# Patient Record
Sex: Female | Born: 1960 | Race: Black or African American | Hispanic: No | Marital: Single | Smoking: Current every day smoker
Health system: Southern US, Community
[De-identification: ages and names within clinical notes are randomized; demographics above are authoritative.]

## PROBLEM LIST (undated history)

## (undated) HISTORY — PX: EYE SURGERY: SHX253

---

## 2012-02-20 ENCOUNTER — Ambulatory Visit
Admission: RE | Admit: 2012-02-20 | Discharge: 2012-02-20 | Disposition: A | Discharge status: Home / Self Care | Payer: Medicaid Other | Source: Ambulatory Visit | Attending: Family Medicine | Admitting: Family Medicine

## 2012-02-20 ENCOUNTER — Other Ambulatory Visit: Payer: Self-pay | Admitting: Family Medicine

## 2012-02-20 DIAGNOSIS — I10 Essential (primary) hypertension: Secondary | ICD-10-CM

## 2012-02-20 DIAGNOSIS — F17200 Nicotine dependence, unspecified, uncomplicated: Secondary | ICD-10-CM

## 2013-11-23 ENCOUNTER — Ambulatory Visit (INDEPENDENT_AMBULATORY_CARE_PROVIDER_SITE_OTHER): Payer: PRIVATE HEALTH INSURANCE | Admitting: Family Medicine

## 2013-11-23 ENCOUNTER — Ambulatory Visit (INDEPENDENT_AMBULATORY_CARE_PROVIDER_SITE_OTHER): Payer: PRIVATE HEALTH INSURANCE

## 2013-11-23 VITALS — BP 160/92 | HR 71 | Temp 98.2°F | Resp 20 | Ht 68.0 in | Wt 210.4 lb

## 2013-11-23 DIAGNOSIS — R030 Elevated blood-pressure reading, without diagnosis of hypertension: Secondary | ICD-10-CM

## 2013-11-23 DIAGNOSIS — M542 Cervicalgia: Secondary | ICD-10-CM

## 2013-11-23 DIAGNOSIS — M62838 Other muscle spasm: Secondary | ICD-10-CM

## 2013-11-23 DIAGNOSIS — IMO0001 Reserved for inherently not codable concepts without codable children: Secondary | ICD-10-CM

## 2013-11-23 DIAGNOSIS — M6248 Contracture of muscle, other site: Secondary | ICD-10-CM

## 2013-11-23 DIAGNOSIS — L509 Urticaria, unspecified: Secondary | ICD-10-CM

## 2013-11-23 MED ORDER — CYCLOBENZAPRINE HCL 5 MG PO TABS: ORAL_TABLET | ORAL | Status: AC

## 2013-11-23 MED ORDER — MELOXICAM 7.5 MG PO TABS: 7.5000 mg | ORAL_TABLET | Freq: Every day | ORAL | Status: AC
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
## 2013-11-24 ENCOUNTER — Telehealth: Payer: Self-pay

## 2013-11-24 NOTE — Telephone Encounter (Signed)
Pt states she did get out of work note until nov 11,but note must say rtw with no restrictions, Please fax to [phone]   Best phone for pt is [phone]

## 2013-11-25 NOTE — Telephone Encounter (Signed)
Printed note. Called and informed pt I will fax to given number.

## 2013-11-26 ENCOUNTER — Telehealth: Payer: Self-pay

## 2013-11-26 NOTE — Telephone Encounter (Signed)
Reprinted note- the one originally faxed states pt may rtn to work today without restrictions. LM for pt to rtn call and see if anything else needs to be indicated on this note for her employer to accept.

## 2013-11-26 NOTE — Telephone Encounter (Signed)
Pt states we faxed a work note for her, but it wasn't accepted from her job, the note need to say she can return to work today with no restrictions and that wasn't put on there Please fax to [phone] attn: payroll and you may reach pt at [phone] if needed
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
## 2019-07-24 ENCOUNTER — Ambulatory Visit: Admit: 2019-07-24 | Payer: PRIVATE HEALTH INSURANCE | Primary: Adolescent Medicine

## 2019-07-24 ENCOUNTER — Inpatient Hospital Stay: Admit: 2019-07-24 | Discharge: 2019-07-24 | Payer: PRIVATE HEALTH INSURANCE | Primary: Adolescent Medicine

## 2019-07-24 ENCOUNTER — Encounter: Admit: 2019-07-24 | Payer: PRIVATE HEALTH INSURANCE | Primary: Adolescent Medicine

## 2019-07-24 DIAGNOSIS — J449 Chronic obstructive pulmonary disease, unspecified: Secondary | ICD-10-CM

## 2019-07-24 DIAGNOSIS — M329 Systemic lupus erythematosus, unspecified: Secondary | ICD-10-CM

## 2019-07-24 LAB — C3 COMPLEMENT: BKR C3 COMPLEMENT: 160 mg/dL (ref 90–180)

## 2019-07-24 LAB — C4 COMPLEMENT     (BH GH L LMW YH): BKR C4 COMPLEMENT: 48 mg/dL — ABNORMAL HIGH (ref 10–40)

## 2019-07-28 LAB — CHROMATIN (NUCLEOSOMAL) ANTIBODY (BH GH LMW Q YH): CHROMATIN (NUCLEOSOMAL) ANTIBODY: <1

## 2019-07-28 LAB — DNA ANTIBODY, DOUBLE-STRANDED: BKR DSDNA AB: 4.2 IU/mL (ref <10.0)

## 2019-08-04 LAB — ALPHA-1-ANTITRYPSIN PHENOTYPE   (BH GH LMW Q YH)

## 2019-11-18 ENCOUNTER — Ambulatory Visit: Admit: 2019-11-18 | Payer: PRIVATE HEALTH INSURANCE | Attending: Internal Medicine | Primary: Adolescent Medicine

## 2019-11-18 DIAGNOSIS — Z20822 Contact with and (suspected) exposure to covid-19: Secondary | ICD-10-CM

## 2019-11-20 ENCOUNTER — Ambulatory Visit: Admit: 2019-11-20 | Payer: PRIVATE HEALTH INSURANCE | Attending: Internal Medicine | Primary: Adolescent Medicine

## 2019-11-20 DIAGNOSIS — Z20822 Contact with and (suspected) exposure to covid-19: Secondary | ICD-10-CM

## 2020-11-30 ENCOUNTER — Encounter: Admit: 2020-11-30 | Payer: PRIVATE HEALTH INSURANCE | Primary: Adolescent Medicine

## 2021-02-14 ENCOUNTER — Inpatient Hospital Stay: Admit: 2021-02-14 | Discharge: 2021-02-14 | Payer: PRIVATE HEALTH INSURANCE | Primary: Adolescent Medicine

## 2021-02-14 DIAGNOSIS — J849 Interstitial pulmonary disease, unspecified: Secondary | ICD-10-CM

## 2021-05-10 ENCOUNTER — Ambulatory Visit: Admit: 2021-05-10 | Payer: PRIVATE HEALTH INSURANCE | Attending: Rheumatology | Primary: Adolescent Medicine

## 2021-05-10 DIAGNOSIS — J849 Interstitial pulmonary disease, unspecified: Secondary | ICD-10-CM

## 2021-05-10 DIAGNOSIS — M329 Systemic lupus erythematosus, unspecified: Secondary | ICD-10-CM

## 2021-05-10 DIAGNOSIS — J449 Chronic obstructive pulmonary disease, unspecified: Secondary | ICD-10-CM

## 2021-05-10 DIAGNOSIS — Z780 Asymptomatic menopausal state: Secondary | ICD-10-CM

## 2021-05-10 NOTE — Progress Notes
[facility] [place], [place], [address]ZDGLO: [phone]  Fax: [name] Damya Comley, MDSonia Kallie Locks, MDKristin Vassie Loll PA-CHPI DETAILS: Sheila Turner presents today for Rheumatology Consultation. She is a former patient of mine last seen more than 15 years ago.I have reviewed the patient's referral records, including past medical history, past physical exams, pertinent labs, imaging and prior plans of care.At her initial presentation she notes that she would often awake with pain in her arms or legs. She was evaluated and diagnosed with SLE. She was treated with Plaquenil and low dose prednisone and did well. Her mother, Sheila Turner, also a former patient of mine, died in 07/16/11 and Sheila Turner moved to [place]. She returned to [place] in 16-Jul-2014. At that time she was experiencing diffuse urticaria. She was evaluated at the [facility] and was treated with antihistamines without any relief of her symptoms. She was then seen at an urgent care center by Dr. Pricilla Riffle who treated her with Atarax which she found helpful. She was then evaluated by Dr. Marko Plume, an allergist, who felt that her urticaria was related to her SLE. He treated her with Xolair injections. Dr. Arie Sabina died and she was then seen by Dr. Polo Riley. Her urticaria resolved and she did no require any further treatment. She was then evaluated by Dr. Atilano Median for her SLE but was lost to follow up after 1 visit. She comes in today for consultation.On 02/14/2021 she had a high resolution [place] that revealed Interstitial lung disease as described above. Findings most likely represent lupus related interstitial lung disease although some of these changes could be smoking-related given the patient's history. She was started on Cellcept by Dr. Damian Leavell. ROS:  She denies systemic symptoms specifically denying fever, fatigue, rash, adenopathy, dry eyes, dry mouth, oral ulcerations, alopecia, anorexia, weight loss, Raynaud?s phenomenon, dysphagia, photosensitivity, chest pain, SOB, or abdominal pain.  She also denies eye inflammation, urinary tract symptoms, urethritis or diarrhea. She also denies scalp tenderness, jaw claudication, or visual disturbance.PMH: SLE, Hypertension, interstitial lung disease, COPD, urticaria, post cholecystectomy pulmonary embolism.PSH: Hysterectomy 07/16/94), Cholecystectomy.FH: Her mother had arthritis. She has no family history of other Rheumatic Disease, Inflammatory Bowel Disease or Psoriasis. SH: She smokes 2-4 cigarettes a day having smoked 1 pack every 2-3 days in the past. She does not drink Alcohol. She drinks 2 cup of coffee a day and 2-3 cans of soda daily. She does not exercise. She is married x3 divorced x2. She has 4 children 46m, 44m, 68m, 58f. She has 14 grandchildren. 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 Current Outpatient Medications on File Prior to Visit Medication Sig Dispense Refill ? albuterol sulfate (VENTOLIN HFA) 90 mcg/actuation HFA aerosol inhaler Inhale 2 puffs into the lungs every 6 (six) hours as needed for wheezing or shortness of breath. 18 g 11 ? EPINEPHrine (EPI-PEN) 0.3 mg/0.3 mL Inject 0.3 mg into the muscle once as needed. for up to 1 dose. As directed by MD. 2 Device 0 ? fluticasone furoate-umeclidinium-vilanterol (TRELEGY ELLIPTA) 100-62.5-25 mcg powder for inhalation Inhale 1 puff into the lungs daily. 60 each 11 ? hydroCHLOROthiazide (HYDRODIURIL) 25 MG tablet Take 10 mg by mouth daily..   ? hydrOXYzine (ATARAX) 25 mg tablet Take 1 tablet (25 mg total) by mouth every 6 (six) hours as needed for itching. 120 tablet 0 ? mycophenolate mofetil (CELLCEPT) 500 mg tablet Take 1 tablet (500 mg  total) by mouth 2 (two) times daily. 60 tablet 3 ? nystatin (MYCOSTATIN) 100,000 unit/mL suspension Take 5 mLs (500,000 Units total) by mouth 4 (four) times daily. 473 mL 2 ? omeprazole (PRILOSEC) 40 MG capsule Take 1 capsule (40 mg total) by mouth daily.   ? predniSONE (DELTASONE) 5 mg tablet Take 1 tablet (5 mg total) by mouth daily. Take with food. 30 tablet 3 ? verapamil (CALAN) 80 MG Immediate Release tablet Take 1 tablet (80 mg total) by mouth 3 (three) times daily.   ? benzonatate (TESSALON) 200 mg capsule Take 1 capsule (200 mg total) by mouth 3 (three) times daily as needed for cough. (Patient not taking: Reported on 05/10/2021) 30 capsule 2 ? buPROPion SR (WELLBUTRIN SR) 150 mg 12 hr tablet Take 1 tablet (150 mg total) by mouth 2 (two) times daily. (Patient not taking: Reported on 05/10/2021) 60 tablet 3 ? montelukast (SINGULAIR) 10 mg tablet Take 1 tablet (10 mg total) by mouth nightly. (Patient not taking: Reported on 05/10/2021) 30 tablet 6 Current Facility-Administered Medications on File Prior to Visit Medication Dose Route Frequency Provider Last Rate Last Admin ? OMALIzumab Geoffry [place]) injection 150 mg  150 mg Subcutaneous Q28 Days Marko Plume, MD   150 mg at 09/28/16 1645 ? OMALIzumab Geoffry Paradise) injection 300 mg  300 mg Subcutaneous Q28 Days Marko Plume, MD   300 mg at 11/16/16 1625 Physical Exam EXAM DETAILS: BP (!) (P) 144/83  - Pulse (P) 79  - Wt 107.5 kg  - LMP  (LMP Unknown)  - SpO2 (P) 98%  - BMI 37.12 kg/m? On exam today she was overweight but otherwise well appearing with a normal gait. She was oriented x 3. She had multiple moles on her face, back, and arms. She had no oral lesions. Conjunctivae were clear. She had no cervical or axillary adenopathy. Lungs were clear. Heart exam was WNL. She had no cyanosis or clubbing.  On examination of her joints she had normal DIPs, PIPs & MCPs.  She had full ROM of her wrists, elbows & shoulders.  She had full ROM of her neck & back.  She had full ROM of her hips.  Knees, ankles and feet were WNL. She had no acute synovitis of any joints. She had no pedal edema.Laboratory test results from 08/10/2016 were reviewed.ImagingHigh Contrast Chest [place] (02/14/2021) IMPRESSION: Interstitial lung disease as described above. Findings most likely represent lupus related interstitial lung disease although some of these changes could be smoking-related given the patient's history. ASSESSMENT/PLAN DETAILS: SLEMs. Sheila Turner comes in today for rheumatology consultation. She has a history of systemic lupus erythematosus and has been treated with Plaquenil in the past. More recently, she was noted on 02/14/2021 to have findings on high resolution [place] that most likely represent lupus related interstitial lung disease although some of these changes could be smoking-related given the patient's history. She was started on Cellcept by Dr. Damian Leavell. Currently, she has no other symptoms suggestive of active SLE. She denies joint pain, joint swelling, fatigue, or other systemic symptoms. To further evaluate her, appropriate laboratory studies were obtained. She will follow up when available. In the interim she will continue prednisone 5 mg qd and Cellcept 500 mg bid.Bone HealthDXA BMD was ordered.COVID-19She has received 2 doses of the Covid-19 Vaccine. She was advised to follow safety precautions. Scribed for Willoughby Doell, Mickeal Needy, MD by Chapman Moss, medical scribe May 06, 2021  The documentation recorded by the scribe accurately reflects the services I personally performed and the decisions  made by me. I reviewed and confirmed all material entered and/or pre-charted by the scribe. Mickeal Needy Mosie Angus, MDElectronically Signed by Mickeal Needy Keiran Sias, MD, April 25, 2023On the day of this patient's encounter, a total of 97 minutes was personally spent by me.  This does not include any resident/fellow teaching time, or any time spent performing a procedural service.

## 2021-05-13 ENCOUNTER — Encounter
Admit: 2021-05-13 | Payer: PRIVATE HEALTH INSURANCE | Attending: Vascular and Interventional Radiology | Primary: Adolescent Medicine

## 2021-05-16 ENCOUNTER — Ambulatory Visit: Admit: 2021-05-16 | Payer: PRIVATE HEALTH INSURANCE | Primary: Adolescent Medicine

## 2021-05-19 ENCOUNTER — Encounter: Admit: 2021-05-19 | Payer: PRIVATE HEALTH INSURANCE | Primary: Adolescent Medicine

## 2021-05-19 LAB — PROTEIN, TOTAL W/CREAT, RANDOM URINE     (L Q)
CREATININE, RANDOM URINE: 227 mg/dL (ref 20–275)
PROTEIN, TOTAL AND CREAT, URINE, 24 HOUR: 70 mg/g creat (ref <20.0)
PROTEIN, TOTAL, RANDOM UR: 16 mg/dL (ref <=2)
PROTEIN/CREATININE RATIO: 0.07 mg/mg creat (ref <=5)

## 2021-05-19 LAB — URINALYSIS-MACROSCOPIC W/REFLEX MICROSCOPIC
BACTERIA, UA: NONE SEEN /HPF
BILIRUBIN: NEGATIVE
GLUCOSE UA (QUEST): NEGATIVE ng/mL — ABNORMAL LOW (ref 30–100)
HYALINE CASTS: NONE SEEN /LPF
LEUKOCYTE ESTERASE: NEGATIVE
NITRITE UA: NEGATIVE
PH: 6.5 g/dL (ref 5.0–8.0)
SPECIFIC GRAVITY UA: 1.017 (ref 1.001–1.035)
WBC, UA: NONE SEEN /HPF (ref <=5)

## 2021-05-19 LAB — VITAMIN D, 25-HYDROXY: VITAMIN D, 25 OH, TOTAL: 12 ng/mL — ABNORMAL LOW (ref 30–100)

## 2021-05-19 LAB — SEDIMENTATION RATE (ESR)

## 2021-05-19 LAB — BETA-2 GLYCOPROTEIN 1 ABS,IGG, IGA, IGM
B2 GLYCOPROTEIN I (IGG)AB: <2 U/mL (ref <20.0)
B2 GLYCOPROTEIN I (IGM) AB: <2 U/mL (ref <20.0)
B2-GLYCOPROTEIN IGA: <2 U/mL (ref <20.0)

## 2021-05-19 LAB — PROTEIN ELECTROPHORESIS, TOTAL      (L Q)
ALBUMIN, SPEP: 3.7 g/dL — ABNORMAL LOW (ref 3.8–4.8)
ALPHA-1-GLOBULIN, SERUM: 0.3 g/dL (ref 0.2–0.3)
ALPHA-2-GLOBULIN, SERUM: 0.9 g/dL (ref 0.5–0.9)
BETA-1-GLOBULIN, SERUM: 0.4 g/dL (ref 0.4–0.6)
BETA-2-GLOBULIN, SERUM: 0.5 g/dL — AB (ref <20.0)
GAMMA GLOBULINS, SERUM: 1.6 g/dL (ref <20.0)
PROTEIN, TOTAL, SPEP: 7.4 g/dL (ref 6.1–8.1)

## 2021-05-19 LAB — LUPUS ANTICOAGULANT EVALUATION WITH REFLEX     (L Q)
DILUTE RUSSELL VIPER VENOM TIME: 45 s (ref <=45)
HEXAGONAL PHASE CONFIRM: NEGATIVE
PTT LA SCREEN: 46 s — ABNORMAL HIGH (ref <=40)

## 2021-05-19 LAB — SJOGRENS AB, SS-B     (BH GH LMW Q YH): SJOGREN'S ANTIBODY (SS-B): <1 AI (ref <1.0)

## 2021-05-19 LAB — SM AND SM/RNP ANTIBODIES     (Q)
SM ANTIBODY: <1 AI (ref <1.0)
SM/RNP ANTIBODY: <1 AI (ref <1.0)

## 2021-05-19 LAB — C-REACTIVE PROTEIN     (CRP): C-REACTIVE PROTEIN: 23.5 mg/L — ABNORMAL HIGH (ref <8.0)

## 2021-05-19 LAB — RHEUMATOID FACTOR: RHEUMATOID FACTOR: <14 IU/mL (ref <14)

## 2021-05-19 LAB — CARDIOLIPIN ANTIBODY, IGG     (BH GH LMW Q YH): CARDIOLIPIN IGG: <2 [GPL'U]/mL

## 2021-05-19 LAB — ANTI-JO 1 ANTIBODY, IGG: JO1 ANTIBODY: <1 AI (ref <1.0)

## 2021-05-19 LAB — DNA ANTIBODY, DOUBLE-STRANDED: DNA DS: 1 IU/mL

## 2021-05-19 LAB — CARDIOLIPIN ANTIBODY, IGM     (BH GH LMW Q YH): CARDIOLIPIN IGM: <2 MPL-U/mL

## 2021-05-19 LAB — ANA IFA SCREEN W/REFL TO TITER AND PATTERN, IFA: ANA SCREEN, IFA: NEGATIVE

## 2021-05-19 LAB — SJOGRENS AB, SS-A     (BH GH LMW Q YH): SJOGREN'S ANTIBODY (SS-A): <1 AI (ref <1.0)

## 2021-05-19 LAB — SCLERODERMA (SCL-70) ANTIBODY: SCL-70 ANTIBODY: <1 AI (ref <1.0)

## 2021-05-19 LAB — C3 + C4 COMPLEMENT COMPONENT     (BH GH Q)
C3 COMPLEMENT: 174 mg/dL (ref 83–193)
C4 COMPLEMENT: 53 mg/dL (ref 15–57)

## 2021-05-19 MED ORDER — CHOLECALCIFEROL (VITAMIN D3) 1,250 MCG (50,000 UNIT) CAPSULE
1250 mcg (50,000 unit) | ORAL_CAPSULE | ORAL | 1 refills | Status: AC
Start: 2021-05-19 — End: ?

## 2021-05-19 NOTE — Telephone Encounter
RN called pt to give her information from provider as below for low potassium level of 3.2:Yes she should call PCP today, or if feeling unwell or cardiac sx ED Pt said she is not having any symptoms she feels fine but will call her PCP. She said thank you for the information.

## 2021-05-19 NOTE — Telephone Encounter
Last visit: 4/25/2023Next visit: none on file RN called pt with information from provider as below:Vitamin D level low will start high dose Vitamin D weekly then 1,000IU daily.Pt demonstrated a positive understanding of information provided. Script pended for sig. Latest Reference Range & Units 05/13/21 07:51 Vitamin D, 25 OH, Total 30 - 100 ng/mL 12 (L) (L): Data is abnormally lowLast labs:Lab Results Component Value Date  WBC 8.1 05/13/2021  HGB 15.0 05/13/2021  HCT 44.4 05/13/2021  MCV 88.8 05/13/2021  MCH 30.0 05/13/2021  MCHC 33.8 05/13/2021  PLT 280 05/13/2021  MPV 10.0 05/13/2021  NEUTROPHILS 53.9 05/13/2021  LYMPHOCYTES 37.8 05/13/2021  MONOCYTES 5.1 05/13/2021  EOSINOPHILS 2.5 05/13/2021  Lab Results Component Value Date  NA 139 05/13/2021  K 3.2 (L) 05/13/2021  CL 98 05/13/2021  CO2 31 05/13/2021  GLU 112 (H) 05/13/2021  BUN 7 05/13/2021  CREATININE 0.94 05/13/2021  EGFRAFRAMER 79 08/10/2016  CALCIUM 9.2 05/13/2021  ALBUMIN 3.9 05/13/2021  PROT 6.9 08/10/2016  BILITOT 0.5 05/13/2021  ALKPHOS 86 05/13/2021  ALT 11 05/13/2021  AST 16 05/13/2021  GLOB 3.5 05/13/2021  Lab Results Component Value Date  CRPQ 23.5 (H) 05/13/2021  SEDRATE TNP 05/13/2021

## 2021-05-30 ENCOUNTER — Ambulatory Visit: Admit: 2021-05-30 | Payer: PRIVATE HEALTH INSURANCE | Primary: Adolescent Medicine

## 2021-07-28 ENCOUNTER — Encounter: Admit: 2021-07-28 | Payer: PRIVATE HEALTH INSURANCE | Attending: Rheumatology | Primary: Adolescent Medicine

## 2021-07-28 DIAGNOSIS — M329 Systemic lupus erythematosus, unspecified: Secondary | ICD-10-CM

## 2021-07-28 DIAGNOSIS — Z780 Asymptomatic menopausal state: Secondary | ICD-10-CM

## 2021-07-28 DIAGNOSIS — J849 Interstitial pulmonary disease, unspecified: Secondary | ICD-10-CM

## 2021-07-28 DIAGNOSIS — J449 Chronic obstructive pulmonary disease, unspecified: Secondary | ICD-10-CM

## 2021-07-28 NOTE — Progress Notes
[facility] [place], [place], [address]WUJWJ: [phone]  Fax: [name] Safal Halderman, MDSonia Kallie Locks, MDKristin Vassie Loll PA-CHPI DETAILS: Sheila Turner presents today for follow up of her SLE. She states, I'm good.Her current medications include Cellcept bid.At her initial presentation she notes that she would often awake with pain in her arms or legs. She was evaluated and diagnosed with SLE. She was treated with Plaquenil and low dose prednisone and did well. Her mother, [name], also a former patient of mine, died in 2011-07-19 and Sheila Turner moved to [place]. She returned to [place] in 19-Jul-2014. At that time she was experiencing diffuse urticaria. She was evaluated at the [facility] and was treated with antihistamines without any relief of her symptoms. She was then seen at an urgent care center by Dr. Pricilla Riffle who treated her with Atarax which she found helpful. She was then evaluated by Dr. Marko Plume, an allergist, who felt that her urticaria was related to her SLE. He treated her with Xolair injections. Dr. Arie Sabina died and she was then seen by Dr. Polo Riley. Her urticaria resolved and she did no require any further treatment. She was then evaluated by Dr. Atilano Median for her SLE but was lost to follow up after 1 visit. She comes in today for consultation.On 02/14/2021 she had a high resolution [place] that revealed interstitial lung disease that most likely represented lupus related interstitial lung disease although some changes could be smoking-related given the patient's history. She smokes 1 pack per week. She currently lives with her husband who is a non-smoker. She does not smoke in her car or while she is at work, but she will smoke at home. ROS:  She denies systemic symptoms specifically denying fever, fatigue, rash, adenopathy, dry eyes, dry mouth, oral ulcerations, alopecia, anorexia, weight loss, Raynaud?s phenomenon, dysphagia, photosensitivity, chest pain, SOB, or abdominal pain. She also denies eye inflammation, urinary tract symptoms, urethritis or diarrhea. She also denies scalp tenderness, jaw claudication, or visual disturbance.PMH: SLE, Hypertension, interstitial lung disease, COPD, urticaria, post cholecystectomy pulmonary embolism.PSH: Hysterectomy 1994/07/19), Cholecystectomy.FH: Her mother had arthritis. She has no family history of other Rheumatic Disease, Inflammatory Bowel Disease or Psoriasis. SH: She smokes 2-4 cigarettes a day having smoked 1 pack every 2-3 days in the past. She does not drink Alcohol. She drinks 2 cup of coffee a day and 2-3 cans of soda daily. She does not exercise. She is married x3 divorced x2. She has 4 children 65m, 35m, 31m, 45f. She has 14 grandchildren. 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 05/10/2021 Rheumatology History:At her initial presentation she notes that she would often awake with pain in her arms or legs. She was evaluated and diagnosed with SLE. She was treated with Plaquenil and low dose prednisone and did well. Her mother, [name], also a former patient of mine, died in 07-19-2011 and Sheila Turner moved to [place]. She returned to [place] in 07-19-2014. At that time she was experiencing diffuse urticaria. She was evaluated at the [facility] and was treated with antihistamines without any relief of her symptoms. She was then seen at an urgent care center by Dr. Pricilla Riffle who treated her with Atarax which she found helpful. She was then evaluated by Dr. Marko Plume, an allergist, who felt that her urticaria was related to her SLE. He treated her with Xolair injections. Dr. Arie Sabina  died and she was then seen by Dr. Polo Riley. Her urticaria resolved and she did no require any further treatment. She was then evaluated by Dr. Atilano Median for her SLE but was lost to follow up after 1 visit. She comes in today for consultation.On 02/14/2021 she had a high resolution [place] that revealed Interstitial lung disease as described above. Findings most likely represent lupus related interstitial lung disease although some of these changes could be smoking-related given the patient's history. She was started on Cellcept by Dr. Damian Leavell. Current Outpatient Medications on File Prior to Visit Medication Sig Dispense Refill ? albuterol sulfate (VENTOLIN HFA) 90 mcg/actuation HFA aerosol inhaler Inhale 2 puffs into the lungs every 6 (six) hours as needed for wheezing or shortness of breath. 18 g 11 ? cholecalciferol, vitamin D3, 1,250 mcg (50,000 unit) capsule Take 1 capsule (50,000 Units total) by mouth once a week. 12 capsule 0 ? EPINEPHrine (EPI-PEN) 0.3 mg/0.3 mL Inject 0.3 mg into the muscle once as needed. for up to 1 dose. As directed by MD. 2 Device 0 ? fluticasone furoate-umeclidinium-vilanterol (TRELEGY ELLIPTA) 100-62.5-25 mcg powder for inhalation Inhale 1 puff into the lungs daily. 60 each 11 ? hydroCHLOROthiazide (HYDRODIURIL) 25 MG tablet Take 10 mg by mouth daily..   ? hydrOXYzine (ATARAX) 25 mg tablet Take 1 tablet (25 mg total) by mouth every 6 (six) hours as needed for itching. 120 tablet 0 ? mycophenolate mofetil (CELLCEPT) 500 mg tablet Take 1 tablet (500 mg total) by mouth 2 (two) times daily. 60 tablet 3 ? nystatin (MYCOSTATIN) 100,000 unit/mL suspension Take 5 mLs (500,000 Units total) by mouth 4 (four) times daily. 473 mL 2 ? omeprazole (PRILOSEC) 40 MG capsule Take 1 capsule (40 mg total) by mouth daily.   ? predniSONE (DELTASONE) 5 mg tablet Take 1 tablet (5 mg total) by mouth daily. Take with food. 30 tablet 3 ? verapamil (CALAN) 80 MG Immediate Release tablet Take 1 tablet (80 mg total) by mouth 2 (two) times daily (0800, 1800).   ? benzonatate (TESSALON) 200 mg capsule Take 1 capsule (200 mg total) by mouth 3 (three) times daily as needed for cough. (Patient not taking: Reported on 05/10/2021) 30 capsule 2 ? buPROPion SR (WELLBUTRIN SR) 150 mg 12 hr tablet Take 1 tablet (150 mg total) by mouth 2 (two) times daily. (Patient not taking: Reported on 05/10/2021) 60 tablet 3 ? montelukast (SINGULAIR) 10 mg tablet Take 1 tablet (10 mg total) by mouth nightly. (Patient not taking: Reported on 05/10/2021) 30 tablet 6 No current facility-administered medications on file prior to visit. Physical Exam EXAM DETAILS: Vital signs not taken due to video visit.Limited physical exam was performed due to video/phone call visit. Laboratory test results from 05/13/2021 were reviewed.ImagingHigh Contrast Chest [place] (02/14/2021) IMPRESSION: Interstitial lung disease as described above. Findings most likely represent lupus related interstitial lung disease although some of these changes could be smoking-related given the patient's history. ASSESSMENT/PLAN DETAILS: SLEMs. Turner is seen today via video visit for rheumatology follow up. She has a history of systemic lupus erythematosus and has been treated with Plaquenil in the past. More recently, she was noted on 02/14/2021 to have findings on high resolution [place] that most likely represent lupus related interstitial lung disease although some of these changes could be smoking-related given the patient's history. She was started on Cellcept by Dr. Damian Leavell. Currently, she has no other symptoms suggestive of active SLE. She denies joint pain, joint swelling, fatigue, or other systemic symptoms. She is  doing well and will continue prednisone 5 mg qd and Cellcept 500 mg bid.ILDShe has changes consistent with ILD which may be related to her underlying SLE or to cigarette smoking. She was strongly urged to discontinue smoking. This should be somewhat easier now that her daughter and aunt, who both smoke, no longer live with her. She will follow up with Dr. Damian Leavell. Cigarette useAs noted above she was strongly urged to stop smoking. Ms. Salo was counseled on the dangers of tobacco use, and was advised to quit.  Tobacco cessation is of particular importance for Sheila Turner because of her history of ILD.  We reviewed strategies to maximize success. She was advised to follow up with her primary care physician.  5 minutes of this encounter were spent performing tobacco cessation counseling.Bone HealthShe has inadequate insurance and is unable to pay for doctor's visits and medications. As a result she would like to put off repeat DXA BMD.COVID-19She has received 2 doses of the Covid-19 Vaccine. She was advised to follow safety precautions. VIDEO TELEHEALTH VISIT: This clinician is part of the telehealth program and is conducting this visit in a currently approved location. For this visit the clinician and patient were present via interactive audio & video telecommunications system that permits real-time communications, via the [employer].Patient's use of the telehealth platform followed consent and acknowledges agreement to permit telehealth for this visit. [place] patient is located in: [place] clinician is appropriately licensed in the above state to provide care for this visit. Other individuals present during the telehealth encounter and their role/relation: none On the day of this patient's encounter, a total of 30 minutes was personally spent by me.  This does not include any resident/fellow teaching time, or any time spent performing a procedural service. Because this visit was completed over video, a hands-on physical exam was not performed.  Patient/parent or guardian understands and knows to call back if condition changes.Sheila Turner, MDElectronically Signed by Leeanne Mannan, MD, July 13, 2023Scribed for Darlinda Bellows, Sheila Needy, MD by Chapman Moss, medical scribe July 26, 2021  The documentation recorded by the scribe accurately reflects the services I personally performed and the decisions made by me. I reviewed and confirmed all material entered and/or pre-charted by the scribe.

## 2021-11-28 ENCOUNTER — Encounter: Admit: 2021-11-28 | Payer: PRIVATE HEALTH INSURANCE | Attending: Rheumatology | Primary: Adolescent Medicine

## 2021-11-28 NOTE — Telephone Encounter
Last visit: 7/13/23Next visit: No future apptsLast labs:Lab Results Component Value Date  WBC 8.1 05/13/2021  HGB 15.0 05/13/2021  HCT 44.4 05/13/2021  MCV 88.8 05/13/2021  MCH 30.0 05/13/2021  MCHC 33.8 05/13/2021  PLT 280 05/13/2021  MPV 10.0 05/13/2021  NEUTROPHILS 53.9 05/13/2021  LYMPHOCYTES 37.8 05/13/2021  MONOCYTES 5.1 05/13/2021  EOSINOPHILS 2.5 05/13/2021  Lab Results Component Value Date  NA 139 05/13/2021  K 3.2 (L) 05/13/2021  CL 98 05/13/2021  CO2 31 05/13/2021  GLU 112 (H) 05/13/2021  BUN 7 05/13/2021  CREATININE 0.94 05/13/2021  EGFRAFRAMER 79 08/10/2016  CALCIUM 9.2 05/13/2021  ALBUMIN 3.9 05/13/2021  PROT 6.9 08/10/2016  BILITOT 0.5 05/13/2021  ALKPHOS 86 05/13/2021  ALT 11 05/13/2021  AST 16 05/13/2021  GLOB 3.5 05/13/2021  Lab Results Component Value Date  SEDRATE TNP 05/13/2021

## 2021-12-01 ENCOUNTER — Encounter: Admit: 2021-12-01 | Payer: PRIVATE HEALTH INSURANCE | Primary: Adolescent Medicine

## 2021-12-01 NOTE — Telephone Encounter
Last visit: 7/13/2023Next visit: 12/07/2021 & 7/23/2024Last labs:Lab Results Component Value Date  WBC 8.1 05/13/2021  HGB 15.0 05/13/2021  HCT 44.4 05/13/2021  MCV 88.8 05/13/2021  MCH 30.0 05/13/2021  MCHC 33.8 05/13/2021  PLT 280 05/13/2021  MPV 10.0 05/13/2021  NEUTROPHILS 53.9 05/13/2021  LYMPHOCYTES 37.8 05/13/2021  MONOCYTES 5.1 05/13/2021  EOSINOPHILS 2.5 05/13/2021  Lab Results Component Value Date  NA 139 05/13/2021  K 3.2 (L) 05/13/2021  CL 98 05/13/2021  CO2 31 05/13/2021  GLU 112 (H) 05/13/2021  BUN 7 05/13/2021  CREATININE 0.94 05/13/2021  EGFRAFRAMER 79 08/10/2016  CALCIUM 9.2 05/13/2021  ALBUMIN 3.9 05/13/2021  PROT 6.9 08/10/2016  BILITOT 0.5 05/13/2021  ALKPHOS 86 05/13/2021  ALT 11 05/13/2021  AST 16 05/13/2021  GLOB 3.5 05/13/2021  Lab Results Component Value Date  CRPQ 23.5 (H) 05/13/2021  SEDRATE TNP 05/13/2021

## 2021-12-02 ENCOUNTER — Telehealth: Admit: 2021-12-02 | Payer: PRIVATE HEALTH INSURANCE | Primary: Adolescent Medicine

## 2021-12-02 DIAGNOSIS — J849 Interstitial pulmonary disease, unspecified: Secondary | ICD-10-CM

## 2021-12-02 MED ORDER — MYCOPHENOLATE MOFETIL 500 MG TABLET
500 mg | ORAL_TABLET | Freq: Two times a day (BID) | ORAL | 1 refills | Status: AC
Start: 2021-12-02 — End: ?

## 2021-12-02 NOTE — Telephone Encounter
Pt called in to ask why her Cellcept was not refilled. RN explained that it was last ordered by her Pulmonary MD. Pt said that her provider knows she is on this medication ad agrees with the therapy. Pt is asking for the refill.

## 2021-12-02 NOTE — Telephone Encounter
Case reviewed with Dr Luan Pulling and Dr Charlann Noss requesting we assume her Cellcept prescription as we are seeing her for lupusShe is out of refills, sent to pharmacy as requestedElectronically Signed by Bettina Gavia, PA, December 02, 2021

## 2021-12-04 MED ORDER — MYCOPHENOLATE MOFETIL 500 MG TABLET
500 mg | ORAL_TABLET | Freq: Two times a day (BID) | ORAL | 4 refills | Status: AC
Start: 2021-12-04 — End: ?

## 2021-12-07 ENCOUNTER — Encounter: Admit: 2021-12-07 | Payer: PRIVATE HEALTH INSURANCE | Primary: Adolescent Medicine

## 2021-12-07 DIAGNOSIS — R0602 Shortness of breath: Secondary | ICD-10-CM

## 2021-12-07 DIAGNOSIS — J849 Interstitial pulmonary disease, unspecified: Secondary | ICD-10-CM

## 2021-12-07 DIAGNOSIS — M329 Systemic lupus erythematosus, unspecified: Secondary | ICD-10-CM

## 2021-12-07 DIAGNOSIS — M791 Myalgia, unspecified site: Secondary | ICD-10-CM

## 2021-12-07 NOTE — Progress Notes
[facility] [place], [place], [address]UEAVW: [phone]  Fax: [name] Desir, MDSonia, Kallie Locks, MDKristin Vassie Loll PA-CPatient Name:  Sheila Turner of Birth:  03-16-1962Date of Service: 11/22/2023YNHH MRN:  [id number] EPIC MRN:  [id number] Encounter Provider:  Governor Specking, PA-CAttending Physician: Mickeal Needy Desir, MDHistory of Present Illness:1. SLE2. ILD/COPDPrior History:Review of Dr Desir's Notes:07/28/21 Rheumatology Visit:Ms. Beerman presents today for follow up of her SLE. ?She states, I'm good.?Her current medications include Cellcept bid.?At her initial presentation she notes that she would often awake with pain in her arms or legs. She was evaluated and diagnosed with SLE. She was treated with Plaquenil and low dose prednisone and did well. Her mother, [name], also a former patient of mine, died in August 01, 2011 and Ms. Burchill moved to [place]. She returned to [place] in 08-01-14. At that time she was experiencing diffuse urticaria. She was evaluated at the [facility] and was treated with antihistamines without any relief of her symptoms. She was then seen at an urgent care center by Dr. Pricilla Riffle who treated her with Atarax which she found helpful. She was then evaluated by Dr. Marko Plume, an allergist, who felt that her urticaria was related to her SLE. He treated her with Xolair injections. Dr. Arie Sabina died and she was then seen by Dr. Polo Riley. Her urticaria resolved and she did no require any further treatment. She was then evaluated by Dr. Atilano Median for her SLE but was lost to follow up after 1 visit. She comes in today for consultation.?On 02/14/2021 she had a high resolution [place] that revealed interstitial lung disease that most likely represented lupus related interstitial lung disease although some changes could be smoking-related given the patient's history. She smokes 1 pack per week. She currently lives with her husband who is a non-smoker. She does not smoke in her car or while she is at work, but she will smoke at home. 05/10/2021 Rheumatology History:At her initial presentation she notes that she would often awake with pain in her arms or legs. She was evaluated and diagnosed with SLE. She was treated with Plaquenil and low dose prednisone and did well. Her mother, [name], also a former patient of mine, died in 08/01/2011 and Ms. Appell moved to [place]. She returned to [place] in 2014/08/01. At that time she was experiencing diffuse urticaria. She was evaluated at the [facility] and was treated with antihistamines without any relief of her symptoms. She was then seen at an urgent care center by Dr. Pricilla Riffle who treated her with Atarax which she found helpful. She was then evaluated by Dr. Marko Plume, an allergist, who felt that her urticaria was related to her SLE. He treated her with Xolair injections. Dr. Arie Sabina died and she was then seen by Dr. Polo Riley. Her urticaria resolved and she did no require any further treatment. She was then evaluated by Dr. Atilano Median for her SLE but was lost to follow up after 1 visit. She comes in today for consultation.?On 02/14/2021 she had a high resolution [place] that revealed Interstitial lung disease as described above. Findings most likely represent lupus related interstitial lung disease although some of these changes could be smoking-related given the patient's history. She was started on Cellcept by Dr. Damian Leavell. Vaccinations:Immunization History Administered Date(s) Administered ? COVID-19, MODERNA 12Y+, 0.5 mL 03/06/2019 ? COVID-19, MODERNA Booster, 0.48mL 01/01/2020 ? Influenza, injectable, quadrivalent, preservative free 09/19/2018, 09/13/2020 ? Pneumococcal conjugate PCV  13 11/17/2019 Updates:She is a poor historian. She was under time constraints at today's visit and stated she had to leave because her husband has a meeting.SLE:She is a poor historian. Had difficulty obtaining history including whether joint pain, morning stiffness or recent joint swelling. She states my leg muscles hurt. I can't describe it. I don't know I can't describe it to you unless your in my body. It's the same pain I've had for years. Today's visit was abbreviated unable to obtain many lupus specific questions as she was unable to stay for the visit. ILD/COPD:She has ILD on chest [place] February 14, 2021 with areas of ground-glass more prominent in upper lobes and mild reticulation and traction bronchiectasis.  She was also noted to have extensive cystic changes in upper lobes and to a lesser extent elsewhere with appreciable air trapping on expiratory imaging.  She states ?I am not short of breath unless I climb stairs otherwise I do not have any breathing problems. Feels her breathing has been stable.  She continues to smoke cigarettes regularly and has no intention stopping despite understanding health risks.  She was started on CellCept February 21, 2021 and Prednisone 5 mg daily. She continues Trelegy 1 inhalation daily, Singulair QHS, Ventolin 2 puffs every 4 hours prn.ROS:No fatigue/malaise, no swollen glands, no mucosal ulcerations, no sicca symptoms, no chest pain/pressure, no palpitations, no DOE or at rest, no cough, no wheezing, no pleuritic discomfort, no muscle aching/muscle weakness, no digital ulcerations, no periungual erythema, no nail pitting, no patchy alopecia, no sclerodactyly, no dactylitis, no heliotrope/malar/shawl rash, no psoriasis, no erythema nodosum. The rest of the review of systems is negative.Medications: Medication Sig ? albuterol sulfate (VENTOLIN HFA) 90 mcg/actuation HFA aerosol inhaler Inhale 2 puffs into the lungs every 6 (six) hours as needed for wheezing or shortness of breath. ? benzonatate (TESSALON) 200 mg capsule Take 1 capsule (200 mg total) by mouth 3 (three) times daily as needed for cough. ? EPINEPHrine (EPI-PEN) 0.3 mg/0.3 mL Inject 0.3 mg into the muscle once as needed. for up to 1 dose. As directed by MD. ? hydroCHLOROthiazide (HYDRODIURIL) 25 MG tablet Take 10 mg by mouth daily.. ? hydrOXYzine (ATARAX) 25 mg tablet Take 1 tablet (25 mg total) by mouth every 6 (six) hours as needed for itching. ? montelukast (SINGULAIR) 10 mg tablet Take 1 tablet (10 mg total) by mouth nightly. ? mycophenolate mofetil (CELLCEPT) 500 mg tablet Take 1 tablet (500 mg total) by mouth 2 (two) times daily. ? mycophenolate mofetil (CELLCEPT) 500 mg tablet Take 1 tablet (500 mg total) by mouth 2 (two) times daily. ? nystatin (MYCOSTATIN) 100,000 unit/mL suspension Take 5 mLs (500,000 Units total) by mouth 4 (four) times daily. ? omeprazole (PRILOSEC) 40 MG capsule Take 1 capsule (40 mg total) by mouth daily. ? verapamil (CALAN) 80 MG Immediate Release tablet Take 1 tablet (80 mg total) by mouth 2 (two) times daily (0800, 1800). Allergies:Allergies Allergen Reactions ? Penicillins  ? Tramadol Palpitations   Can take most NSAIDs Past Medical History:Past medical history was reviewed and includes the following:Past Medical History: Diagnosis Date ? Hives  ? Hypertension  ? Lupus (HC Code) (HC CODE) (HC Code)  Past Surgical History:Past Surgical History: Procedure Laterality Date ? CHOLECYSTECTOMY   ? HYSTERECTOMY  1996  hyst /ovaries preserved : due to Cervical cancer/ pt indicates paps were done post surgery x several years/ wnl per pt ([facility] records not available at time of this visit) Family History:Mother had arthritis. No family history  of Rheumatic Disease/Autoimmune Disease, Inflammatory Bowel Disease or Psoriasis.  Mother with COPD/ILD.Social History:She smokes 2-4 cigarettes per day having smoked 1 pack every 2-3 days in the past.  She does not drink alcohol.  She drinks 2 cups of coffee a day in 2-3 cans of soda daily.  She does not exercise.  She is married x3 divorced x2.  She is 4 children, 3 boys and 1 girl.  She is 14 grandchildren.  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 FEV1 FEV1/FVC TLC DLCO 03/07/2009     50% 03/03/2019   79% 98% 53% 11/15/2020 76% 75%  77% 76% Radiology:[place] Chest without IV contrast high-resolution 1/30/23Comparison: Chest x-ray 10/20/2006FINDINGS:LUNGS AND AIRWAYS: The central airway is patent. There are extensive cystic changes in the upper lobes, to a much lesser extent elsewhere. There are diffuse groundglass changes which also appear more prominent in the upper portions of both lungs. There is mild reticulation and mild traction bronchiectasis. There is mild air trapping on end expiration imaging. There are no suspicious lung nodules.PLEURA: No pleural effusions.LYMPH NODES/MEDIASTINUM: No mediastinal, hilar or axillary adenopathy. The esophagus is normal in appearance.IMPRESSION:Interstitial lung disease as described above. Findings most likely represent lupus related interstitial lung disease although some of these changes could be smoking-related given the patient's history. Labs: Latest Reference Range & Units 05/13/21  Sodium 135 - 146 mmol/L 139 Potassium 3.5 - 5.3 mmol/L 3.2 (L) Chloride 98 - 110 mmol/L 98 CO2 20 - 32 mmol/L 31 BUN 7 - 25 mg/dL 7 Creatinine 2.95 - 2.84 mg/dL 1.32 BUN/Creatinine Ratio 6 - 22 (calc) NOT APPLICABLE eGFR (Creatinine) > OR = 60 mL/min/1.41m2 69 Glucose 65 - 99 mg/dL 440 (H) Calcium 8.6 - 10.2 mg/dL 9.2 Bilirubin NEGATIVE  NEGATIVE Bilirubin, Total 0.2 - 1.2 mg/dL 0.5 Alkaline Phosphatase 37 - 153 U/L 86 ALT (SGPT) 6 - 29 U/L 11 AST 10 - 35 U/L 16 Albumin 3.6 - 5.1 g/dL 3.9 Globulin 1.9 - 3.7 g/dL (calc) 3.5 Albumin/Globulin Ratio 1.0 - 2.5 (calc) 1.1 CRP <8.0 mg/L 23.5 (H) ANA Screen, IFA NEGATIVE  NEGATIVE Protein/Creatinine Ratio 0.024 - 0.184 mg/mg creat 0.070 Protein, Total, Random Ur 5 - 24 mg/dL 16 [id number] 0.2 - 0.3 g/dL 0.3 [id number] 0.5 - 0.9 g/dL 0.9 Interpretation  Comment Only Beta-1-Globulin 0.4 - 0.6 g/dL 0.4 [id number] 0.2 - 0.5 g/dL 0.5 Gamma Globulin 0.8 - 1.7 g/dL 1.6 Protein, Total 6.1 - 8.1 g/dL6.1 - 8.1 g/dL 6.01.0 Albumin 3.8 - 4.8 g/dL 3.7 (L) WBC 3.8 - 93.2 Thousand/uL 8.1 RBC 3.80 - 5.10 Million/uL 5.00 Hemoglobin 11.7 - 15.5 g/dL 35.5 Hematocrit 73.2 - 45.0 % 44.4 MCV 80.0 - 100.0 fL 88.8 MCH 27.0 - 33.0 pg 30.0 MCHC 32.0 - 36.0 g/dL 20.2 RDW-CV 54.2 - 70.6 % 14.8 Platelets 140 - 400 Thousand/uL 280 MPV 7.5 - 12.5 fL 10.0 Neutrophils % 53.9 Monocytes % 5.1 Eosinophils % 2.5 Basophils % 0.7 Neutrophils Absolute 1,500 - 7,800 cells/uL 4,366 Lymphocytes Absolute 850 - 3,900 cells/uL 3,062 Monocytes (Absolute) 200 - 950 cells/uL 413 Basophils Absolute 0 - 200 cells/uL 57 Lymphocytes % 37.8 Eosinophils Absolute 15 - 500 cells/uL 203 Sed Rate By Modified Westergren mm/h TNP Dilute Viper Venom Time <=45 sec 45 Hexagonal Phase  Confirm Negative  Negative Ptt La Screen <=40 sec 46 (H) C3 Complement 83 - 193 mg/dL 161 C4 Complement 15 - 57 mg/dL 53 Rheumatoid Factor <09 IU/mL <14 DNA ds IU/mL 1 Scl-70 Antibody <1.0 NEG AI <1.0 NEG Sjogren's Antibody (SS-A) <1.0 NEG AI <1.0 NEG Sjogren's Antibody (SS-B) <1.0 NEG AI <1.0 NEG Sm Antibody <1.0 NEG AI <1.0 NEG Sm/RNP Antibody <1.0 NEG AI <1.0 NEG Jo1 Antibody <1.0 NEG AI <1.0 NEG Cardiolipin IgG GPL-U/mL <2.0 Cardiolipin IgM MPL-U/mL <2.0 Lupus Anticoagulant Reflex  see note B2-Glycoprotein IgA <20.0 U/mL <2.0 Creatinine, Urine, Random 20 - 275 mg/dL 604 Vitamin D, 25 OH, Total 30 - 100 ng/mL 12 (L) Protein, Total and Creat, Urine, 24 Hour 24 - 184 mg/g creat 70 Color, UA YELLOW  DARK YELLOW Appearance CLEAR  CLEAR Specific Gravity, UA 1.001 - 1.035  1.017 pH: 5.0 - 8.0  6.5 Protein, UA NEGATIVE  TRACE ! Glucose, UA NEGATIVE  NEGATIVE Ketones, UA NEGATIVE  TRACE ! Occult Blood NEGATIVE  TRACE ! Leukocyte Esterase NEGATIVE  NEGATIVE Nitrite, UA NEGATIVE  NEGATIVE Hyaline Casts, UA NONE SEEN /LPF NONE SEEN WBC, UA < OR = 5 /HPF NONE SEEN RBC, UA < OR = 2 /HPF 0-2 Epithelial Cells, Squamous < OR = 5 /HPF 10-20 ! Bacteria, UA NONE SEEN /HPF NONE SEEN Note:  Comment Only B2 Glycoprotein I (IgG)Ab <20.0 U/mL <2.0 B2 Glycoprotein I (IgM) Ab <20.0 U/mL <2.0 Lupus Anticoagulant Reflex see note  Comment: A Lupus Anticoagulant is not detected. Common causes for a prolonged screen and negative confirmatory test include factor deficiencies or anticoagulant therapy. ? ? Reference Range: ?Not Detected   Latest Reference Range & Units 08/10/16  ANA Screen, IFA NEGATIVE  POSITIVE !  Latest Reference Range & Units 08/10/16 ANA Titer 1 titer 1:160 (H)  08/10/16 ANA Pattern 1 HOMOGENEOUS ! Assessment/Plan:LupusShe has history of lupus and has been treated with Plaquenil in the past.  She is history of positive ANA 1-160 homogeneous pattern.  Elevated at 23.5 on May 05, 2021.  Today, she was unable to provide a detailed history due to personal time constraints.  She complains of leg muscle pain.  She continue CellCept 500 mg twice daily and prednisone 5 mg daily tolerating without side effects.  Reviewed medication risks and side effects and she was advised ongoing laboratory monitoring of lupus and immunosuppression (CBC, CMP, ESR, CRP, ANA double-stranded DNA, C3, C4, UA/8, urine protein creatinine ratio).  Given her complaints of leg muscle pain will also check CK, aldolase, panel, Smith/RNP for overlap screening.Interstitial lung disease/COPDShe has interstitial lung disease with diffuse ground-glass changes which are upper lobe predominant and mild reticulation and traction bronchiectasis on chest [place] February 14, 2021.  Also noted were changes which are upper lobe predominant and air trapping on expiratory images which may be more consistent with her smoking history.  She was started on CellCept February 2023 and prednisone 5 mg daily.  Advised ongoing following with Pulmonary team.  We will need repeat high-resolution chest [place] and PFTs to re-evaluate her pulmonary status and starting CellCept.  She has not desaturated on prior walking oximetry.  She has stable shortness of breath with stair climbing.  She otherwise continues Trelegy, Singulair and Ventolin.  Encouraged smoking cessation however she unfortunately has no intention of quitting despite understanding health risks.Bone Health:She previously deferred from pursuing repeat DEXA bone density screening out of concern for absorbing medical costs.RTC in 3 months with Dr DesirElectronically Signed by Bettina Gavia,  PA, December 07, 2021

## 2022-02-01 ENCOUNTER — Ambulatory Visit: Admit: 2022-02-01 | Payer: PRIVATE HEALTH INSURANCE | Attending: Adolescent Medicine | Primary: Adolescent Medicine

## 2022-02-01 ENCOUNTER — Encounter: Admit: 2022-02-01 | Payer: PRIVATE HEALTH INSURANCE | Attending: Adolescent Medicine | Primary: Adolescent Medicine

## 2022-02-01 ENCOUNTER — Inpatient Hospital Stay: Admit: 2022-02-01 | Discharge: 2022-02-01 | Payer: PRIVATE HEALTH INSURANCE | Primary: Adolescent Medicine

## 2022-02-01 DIAGNOSIS — Z20822 Contact with and (suspected) exposure to covid-19: Secondary | ICD-10-CM

## 2022-02-01 DIAGNOSIS — Z7689 Persons encountering health services in other specified circumstances: Secondary | ICD-10-CM

## 2022-02-02 LAB — INFLUENZA A+B/RSV BY RT-PCR
BKR INFLUENZA A: NEGATIVE
BKR INFLUENZA B: NEGATIVE
BKR RESPIRATORY SYNCYTIAL VIRUS: NEGATIVE

## 2022-02-02 LAB — SARS COV-2 (COVID-19) RNA: BKR SARS-COV-2 RNA (COVID-19) (YH): NEGATIVE

## 2022-08-07 ENCOUNTER — Encounter: Admit: 2022-08-07 | Payer: PRIVATE HEALTH INSURANCE | Attending: Pulmonary Disease | Primary: Adolescent Medicine

## 2022-08-07 NOTE — Telephone Encounter
Last visit: 11/22/23Next visit: 7/23/24Last labs:Lab Results Component Value Date  WBC 8.1 05/13/2021  HGB 15.0 05/13/2021  HCT 44.4 05/13/2021  MCV 88.8 05/13/2021  MCH 30.0 05/13/2021  MCHC 33.8 05/13/2021  PLT 280 05/13/2021  MPV 10.0 05/13/2021  NEUTROPHILS 53.9 05/13/2021  LYMPHOCYTES 37.8 05/13/2021  MONOCYTES 5.1 05/13/2021  EOSINOPHILS 2.5 05/13/2021  Lab Results Component Value Date  NA 139 05/13/2021  K 3.2 (L) 05/13/2021  CL 98 05/13/2021  CO2 31 05/13/2021  GLU 112 (H) 05/13/2021  BUN 7 05/13/2021  CREATININE 0.94 05/13/2021  EGFRAFRAMER 79 08/10/2016  CALCIUM 9.2 05/13/2021  ALBUMIN 3.9 05/13/2021  PROT 6.9 08/10/2016  BILITOT 0.5 05/13/2021  ALKPHOS 86 05/13/2021  ALT 11 05/13/2021  AST 16 05/13/2021  GLOB 3.5 05/13/2021  Lab Results Component Value Date  SEDRATE TNP 05/13/2021

## 2022-08-08 ENCOUNTER — Encounter: Admit: 2022-08-08 | Payer: PRIVATE HEALTH INSURANCE | Attending: Rheumatology | Primary: Adolescent Medicine

## 2022-08-08 DIAGNOSIS — Z780 Asymptomatic menopausal state: Secondary | ICD-10-CM

## 2022-08-08 DIAGNOSIS — M329 Systemic lupus erythematosus, unspecified: Secondary | ICD-10-CM

## 2022-08-08 DIAGNOSIS — J849 Interstitial pulmonary disease, unspecified: Secondary | ICD-10-CM

## 2022-08-08 DIAGNOSIS — J449 Chronic obstructive pulmonary disease, unspecified: Secondary | ICD-10-CM

## 2022-08-08 MED ORDER — HYDROXYZINE HCL 25 MG TABLET
25 | ORAL_TABLET | 3 refills | Status: AC
Start: 2022-08-08 — End: ?

## 2022-08-09 MED ORDER — MYCOPHENOLATE MOFETIL 500 MG TABLET
500 | ORAL_TABLET | Freq: Two times a day (BID) | ORAL | 4 refills | Status: AC
Start: 2022-08-09 — End: ?

## 2022-08-09 NOTE — Progress Notes
[facility] [place], [place], [address]UEAVW: [phone]  Fax: [name] Jaanvi Fizer, MDSonia Gordon-Dole, MDDavid N. Corrie Mckusick, MD, PhDKristin Vassie Loll PA-CHPI DETAILS: Ms. Garger presents today via video visit for follow up of her SLE. She is accompanied by her daughter.She states, I'm great.Her current medications include Cellcept qd. This medication was prescribed bid, but she has been taking this qd.She comes in today via video as she has a sore throat and a cough. She has a history of diffuse urticaria believed to be related to her SLE. She has been treated in the past with Xolair and Atarax. Currently, she has occasional episodes of urticaria.On 02/14/2021 she had a high resolution [place] that revealed interstitial lung disease that most likely represented lupus related interstitial lung disease although some changes could be smoking-related given the patient's history. She states she smokes 1 pack per week.ROS:  She denies systemic symptoms specifically denying fever, fatigue, rash, adenopathy, dry eyes, dry mouth, oral ulcerations, alopecia, anorexia, weight loss, Raynaud?s phenomenon, dysphagia, photosensitivity, chest pain, SOB, or abdominal pain. She also denies eye inflammation, urinary tract symptoms, urethritis or diarrhea. She also denies scalp tenderness, jaw claudication, or visual disturbance.PMH: SLE, Hypertension, interstitial lung disease, COPD, urticaria, post cholecystectomy pulmonary embolism.PSH: Hysterectomy 18-Sep-1994), Cholecystectomy.FH: Her mother had arthritis. She has no family history of other Rheumatic Disease, Inflammatory Bowel Disease or Psoriasis. SH: She smokes 2-4 cigarettes a day having smoked 1 pack every 2-3 days in the past. She does not drink Alcohol. She drinks 2 cup of coffee a day and 2-3 cans of soda daily. She does not exercise. She is married x3 divorced x2. She has 4 children 89m, 27m, 88m, 62f. She has 14 grandchildren. 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 Her mother, [name], also a former patient of mine, died in 09-18-11.Rheumatology Interim History, Governor Specking, PA-C:She presented on 12/07/2021 for a follow up for her SLE. She is a poor historian. Had difficulty obtaining history including whether joint pain, morning stiffness or recent joint swelling. She states my leg muscles hurt. I can't describe it. I don't know I can't describe it to you unless your in my body. It's the same pain I've had for years. Today's visit was abbreviated unable to obtain many lupus specific questions as she was unable to stay for the visit. She has ILD on chest [place] February 14, 2021 with areas of ground-glass more prominent in upper lobes and mild reticulation and traction bronchiectasis.  She was also noted to have extensive cystic changes in upper lobes and to a lesser extent elsewhere with appreciable air trapping on expiratory imaging.  She states ?I am not short of breath unless I climb stairs otherwise I do not have any breathing problems. Feels her breathing has been stable.  She continues to smoke cigarettes regularly and has no intention stopping despite understanding health risks.  She was started on CellCept February 21, 2021 and Prednisone 5 mg daily. She continues Trelegy 1 inhalation daily, Singulair QHS, Ventolin 2 puffs every 4 hours prn. Pulmonology Interim History, Dr. Magnus Ivan presented on 11/10/2021 for a follow up for her ILD. Trelegy 1 INH Daily. Ventolin 2 Puffs Q4 PRN. MDI Instructions Reviewed. Singulair 10mg  qhs. Smoking Cessation Reviewed, Needs to Quit. [place] CHEST 01/2022. PFTs Next Visit. Prednisone 5mg  QD. MMF 500mg  BID. CBC, LFTs. F/u 02/2022.07/28/2021 Rheumatology History:She states, I'm good.Her current medications include Cellcept bid.At her initial presentation she notes  that she would often awake with pain in her arms or legs. She was evaluated and diagnosed with SLE. She was treated with Plaquenil and low dose prednisone and did well. Her mother, [name], also a former patient of mine, died in Sep 10, 2011 and Ms. Sakuma moved to [place]. She returned to [place] in 09-10-14. At that time she was experiencing diffuse urticaria. She was evaluated at the [facility] and was treated with antihistamines without any relief of her symptoms. She was then seen at an urgent care center by Dr. Pricilla Riffle who treated her with Atarax which she found helpful. She was then evaluated by Dr. Marko Plume, an allergist, who felt that her urticaria was related to her SLE. He treated her with Xolair injections. Dr. Arie Sabina died and she was then seen by Dr. Polo Riley. Her urticaria resolved and she did no require any further treatment. She was then evaluated by Dr. Atilano Median for her SLE but was lost to follow up after 1 visit. She comes in today for consultation.On 02/14/2021 she had a high resolution [place] that revealed interstitial lung disease that most likely represented lupus related interstitial lung disease although some changes could be smoking-related given the patient's history. She smokes 1 pack per week. She currently lives with her husband who is a non-smoker. She does not smoke in her car or while she is at work, but she will smoke at home. 05/10/2021 Rheumatology History:At her initial presentation she notes that she would often awake with pain in her arms or legs. She was evaluated and diagnosed with SLE. She was treated with Plaquenil and low dose prednisone and did well. Her mother, [name], also a former patient of mine, died in September 10, 2011 and Ms. Schmidtberger moved to [place]. She returned to [place] in 2014-09-10. At that time she was experiencing diffuse urticaria. She was evaluated at the [facility] and was treated with antihistamines without any relief of her symptoms. She was then seen at an urgent care center by Dr. Pricilla Riffle who treated her with Atarax which she found helpful. She was then evaluated by Dr. Marko Plume, an allergist, who felt that her urticaria was related to her SLE. He treated her with Xolair injections. Dr. Arie Sabina died and she was then seen by Dr. Polo Riley. Her urticaria resolved and she did no require any further treatment. She was then evaluated by Dr. Atilano Median for her SLE but was lost to follow up after 1 visit. She comes in today for consultation.On 02/14/2021 she had a high resolution [place] that revealed Interstitial lung disease as described above. Findings most likely represent lupus related interstitial lung disease although some of these changes could be smoking-related given the patient's history. She was started on Cellcept by Dr. Damian Leavell. Current Outpatient Medications on File Prior to Visit Medication Sig Dispense Refill  albuterol sulfate (VENTOLIN HFA) 90 mcg/actuation HFA aerosol inhaler Inhale 2 puffs into the lungs every 6 (six) hours as needed for wheezing or shortness of breath. 18 g 11  benzonatate (TESSALON) 200 mg capsule Take 1 capsule (200 mg total) by mouth 3 (three) times daily as needed for cough. 30 capsule 2  cholecalciferol, vitamin D3, 1,250 mcg (50,000 unit) capsule Take 1 capsule (50,000 Units total) by mouth once a week. (Patient not taking: Reported on 12/07/2021) 12 capsule 0  EPINEPHrine (EPI-PEN) 0.3 mg/0.3 mL Inject 0.3 mg into the muscle once as needed. for up to 1 dose. As  directed by MD. 2 Device 0  hydroCHLOROthiazide (HYDRODIURIL) 25 MG tablet Take 10 mg by mouth daily..    montelukast (SINGULAIR) 10 mg tablet Take 1 tablet (10 mg total) by mouth nightly. 30 tablet 6  mycophenolate mofetil (CELLCEPT) 500 mg tablet Take 1 tablet (500 mg total) by mouth 2 (two) times daily. 60 tablet 3  mycophenolate mofetil (CELLCEPT) 500 mg tablet Take 1 tablet (500 mg total) by mouth 2 (two) times daily. 60 tablet 0  nystatin (MYCOSTATIN) 100,000 unit/mL suspension Take 5 mLs (500,000 Units total) by mouth 4 (four) times daily. 473 mL 2  omeprazole (PRILOSEC) 40 MG capsule Take 1 capsule (40 mg total) by mouth daily.    predniSONE (DELTASONE) 5 mg tablet Take 1 tablet (5 mg total) by mouth daily. Take with food. (Patient not taking: Reported on 12/07/2021) 30 tablet 3  verapamil (CALAN) 80 MG Immediate Release tablet Take 1 tablet (80 mg total) by mouth 2 (two) times daily (0800, 1800).    [DISCONTINUED] hydrOXYzine (ATARAX) 25 mg tablet Take 1 tablet (25 mg total) by mouth every 6 (six) hours as needed for itching. 120 tablet 0 No current facility-administered medications on file prior to visit. Physical Exam EXAM DETAILS: LMP  (LMP Unknown) Limited physical exam was performed due to video/phone call visit. Laboratory test results from 05/13/2021 were reviewed.ImagingHigh Contrast Chest [place] (02/14/2021) IMPRESSION: Interstitial lung disease as described above. Findings most likely represent lupus related interstitial lung disease although some of these changes could be smoking-related given the patient's history. ASSESSMENT/PLAN DETAILS: SLEMs. Cram is seen today for follow up of her SLE. She was noted on 02/14/2021 to have findings on high resolution [place] that most likely represent lupus related interstitial lung disease although some of these changes could be smoking-related given the patient's history. She was started on Cellcept by Dr. Damian Leavell. Currently, she has no other symptoms suggestive of active SLE. She denies joint pain, joint swelling, fatigue, or other systemic symptoms. Her current medications are supposed to be prednisone 5 mg qd and Cellcept 500 mg bid. She has been confused about the Cellcept and has been taking this qd. Therapeutic options were discussed. She will begin taking Cellcept bid. ILDShe has changes consistent with ILD which may be related to her underlying SLE or to cigarette smoking. She was, once again, urged to discontinue smoking. She will follow up with Dr. Damian Leavell. Pulmonology Interim History, Dr. Magnus Ivan presented on 11/10/2021 for a follow up for her ILD. Trelegy 1 INH Daily. Ventolin 2 Puffs Q4 PRN. MDI Instructions Reviewed. Singulair 10mg  qhs. Smoking Cessation Reviewed, Needs to Quit. [place] CHEST 01/2022. PFTs Next Visit. Prednisone 5mg  QD. MMF 500mg  BID. CBC, LFTs. F/u 02/2022.Cigarette useMs. Tullos was counseled on the dangers of tobacco use, and was advised to quit.VIDEO TELEHEALTH VISIT: This clinician is part of the telehealth program and is conducting this visit in a currently approved location. For this visit the clinician and patient were present via interactive audio & video telecommunications system that permits real-time communications, via the [employer].Patient's use of the telehealth platform followed consent and acknowledges agreement to permit telehealth for this visit. State patient is located in: [place] clinician is appropriately licensed in the above state to provide care for this visit. Other individuals present during the telehealth encounter and their role/relation: daughterBecause this visit was completed over video, a hands-on physical exam was not performed. Patient/parent or guardian understands and knows to call back if condition changes.Scribed for Lorre Opdahl, Mickeal Needy, MD by Denny Peon  Vanessa Ralphs, medical scribe August 06, 2022  The documentation recorded by the scribe accurately reflects the services I personally performed and the decisions made by me. I reviewed and confirmed all material entered and/or pre-charted by the scribe. Mickeal Needy Virgil Slinger, MDElectronically Signed by Mickeal Needy Dallis Czaja, MD, August 08, 2022 I spent a total of 58 minutes on the date of this encounter performing the following activities:   Preparing for the visit by reviewing history and previous tests, Performing an exam or evaluation, Counseling and educating the patient/family/caregiver, Ordering medications, tests, or procedures, Documenting clinical information in Epic and Interpreting and communicating results. This does not include any resident/fellow teaching time, or any time spent performing a procedural service.

## 2022-08-11 ENCOUNTER — Encounter: Admit: 2022-08-11 | Payer: PRIVATE HEALTH INSURANCE | Attending: Rheumatology | Primary: Adolescent Medicine

## 2022-11-02 ENCOUNTER — Encounter: Admit: 2022-11-02 | Payer: PRIVATE HEALTH INSURANCE | Attending: Rheumatology | Primary: Adolescent Medicine

## 2022-11-02 NOTE — Telephone Encounter
 Made patient aware that she is due for labsLast visit: 7/23/24Next visit: 10/22/24Last labs:Lab Results Component Value Date  WBC 8.1 05/13/2021  HGB 15.0 05/13/2021  HCT 44.4 05/13/2021  MCV 88.8 05/13/2021  MCH 30.0 05/13/2021  MCHC 33.8 05/13/2021  PLT 280 05/13/2021  MPV 10.0 05/13/2021  NEUTROPHILS 53.9 05/13/2021  LYMPHOCYTES 37.8 05/13/2021  MONOCYTES 5.1 05/13/2021  EOSINOPHILS 2.5 05/13/2021  Lab Results Component Value Date  NA 139 05/13/2021  K 3.2 (L) 05/13/2021  CL 98 05/13/2021  CO2 31 05/13/2021  GLU 112 (H) 05/13/2021  BUN 7 05/13/2021  CREATININE 0.94 05/13/2021  EGFRAFRAMER 79 08/10/2016  CALCIUM 9.2 05/13/2021  ALBUMIN 3.9 05/13/2021  PROT 6.9 08/10/2016  BILITOT 0.5 05/13/2021  ALKPHOS 86 05/13/2021  ALT 11 05/13/2021  AST 16 05/13/2021  GLOB 3.5 05/13/2021  Lab Results Component Value Date  SEDRATE TNP 05/13/2021

## 2022-11-07 ENCOUNTER — Encounter: Admit: 2022-11-07 | Payer: PRIVATE HEALTH INSURANCE | Primary: Adolescent Medicine

## 2022-11-14 ENCOUNTER — Encounter: Admit: 2022-11-14 | Payer: PRIVATE HEALTH INSURANCE | Primary: Adolescent Medicine

## 2022-12-13 ENCOUNTER — Telehealth: Admit: 2022-12-13 | Payer: PRIVATE HEALTH INSURANCE | Primary: Adolescent Medicine

## 2022-12-13 DIAGNOSIS — Z780 Asymptomatic menopausal state: Secondary | ICD-10-CM

## 2022-12-13 DIAGNOSIS — J449 Chronic obstructive pulmonary disease, unspecified: Secondary | ICD-10-CM

## 2022-12-13 DIAGNOSIS — J849 Interstitial pulmonary disease, unspecified: Secondary | ICD-10-CM

## 2022-12-13 DIAGNOSIS — M329 Systemic lupus erythematosus, unspecified: Secondary | ICD-10-CM

## 2022-12-13 NOTE — Telephone Encounter
 Placed labs for patient ?

## 2023-01-17 LAB — URINALYSIS-MACROSCOPIC W/REFLEX MICROSCOPIC
BILIRUBIN: NEGATIVE
GLUCOSE UA (QUEST): NEGATIVE
KETONES UA (QUEST): NEGATIVE
LEUKOCYTE ESTERASE: NEGATIVE
NITRITE UA: NEGATIVE
OCCULT BLOOD: NEGATIVE
PH: 6.5 (ref 5.0–8.0)
PROTEIN UA: NEGATIVE
SPECIFIC GRAVITY UA: 1.009 (ref 1.001–1.035)

## 2023-01-17 LAB — PROTEIN, TOTAL W/CREAT, RANDOM URINE     (L Q)
CREATININE, RANDOM URINE: 71 mg/dL (ref 20–275)
PROTEIN, TOTAL AND CREAT, URINE, 24 HOUR: 127 mg/g{creat} (ref 24–184)
PROTEIN, TOTAL, RANDOM UR: 9 mg/dL (ref 5–24)
PROTEIN/CREATININE RATIO: 0.127 mg/mg{creat} (ref 0.024–0.184)

## 2023-01-19 MED ORDER — HYDROXYZINE HCL 25 MG TABLET
25 | ORAL_TABLET | 1 refills | Status: AC
Start: 2023-01-19 — End: ?

## 2023-01-19 NOTE — Telephone Encounter
 Last visit: 7/23/24Next visit: 1/21/25Last labs:Lab Results Component Value Date  WBC 6.4 01/16/2023  HGB 14.5 01/16/2023  HCT 44.0 01/16/2023  MCV 87.6 01/16/2023  MCH 28.9 01/16/2023  MCHC 33.0 01/16/2023  PLT 311 01/16/2023  MPV 9.9 01/16/2023  NEUTROPHILS 54.2 01/16/2023  LYMPHOCYTES 37.3 01/16/2023  MONOCYTES 5.9 01/16/2023  EOSINOPHILS 1.7 01/16/2023  Lab Results Component Value Date  NA 135 01/16/2023  K 3.8 01/16/2023  CL 98 01/16/2023  CO2 31 01/16/2023  GLU 93 01/16/2023  BUN 9 01/16/2023  CREATININE 0.93 01/16/2023  EGFRAFRAMER 79 08/10/2016  CALCIUM 9.4 01/16/2023  ALBUMIN 3.9 01/16/2023  PROT 6.9 08/10/2016  BILITOT 0.4 01/16/2023  ALKPHOS 94 01/16/2023  ALT 9 01/16/2023  AST 15 01/16/2023  GLOB 3.6 01/16/2023  Lab Results Component Value Date  SEDRATE 34 (H) 01/16/2023

## 2023-01-22 LAB — C3 + C4 COMPLEMENT COMPONENT     (BH GH Q)
C3 COMPLEMENT: 168 mg/dL (ref 83–193)
C4 COMPLEMENT: 40 mg/dL (ref 15–57)

## 2023-01-22 LAB — CBC AND DIFFERENTIAL
BASOPHILS ABSOLUTE COUNT: 58 {cells}/uL (ref 0–200)
BASOPHILS: 0.9 %
EOSINOPHILS ABSOLUTE COUNT: 109 {cells}/uL (ref 15–500)
EOSINOPHILS: 1.7 %
HEMATOCRIT BLOOD: 44 % (ref 35.0–45.0)
HEMOGLOBIN: 14.5 g/dL (ref 11.7–15.5)
LYMPHOCYTES ABSOLUTE COUNT: 2387 {cells}/uL (ref 850–3900)
LYMPHOCYTES: 37.3 %
MCH: 28.9 pg (ref 27.0–33.0)
MCHC-HEMOGLOBINOPATHY: 33 g/dL (ref 32.0–36.0)
MCV: 87.6 fL (ref 80.0–100.0)
MONOCYTES ABSOLUTE COUNT: 378 {cells}/uL (ref 200–950)
MONOCYTES: 5.9 %
MPV: 9.9 fL (ref 7.5–12.5)
NEUTROPHILS ABSOLUTE COUNT: 3469 {cells}/uL (ref 1500–7800)
NEUTROPHILS: 54.2 %
PLATELET COUNT: 311 10*3/uL (ref 140–400)
RDW: 15.3 % — ABNORMAL HIGH (ref 11.0–15.0)
RED BLOOD CELL COUNT: 5.02 10*6/uL (ref 3.80–5.10)
WHITE BLOOD CELL COUNT: 6.4 10*3/uL (ref 3.8–10.8)

## 2023-01-22 LAB — COMPREHENSIVE METABOLIC PANEL
ALBUMIN/GLOBULIN RATIO: 1.1 (calc) (ref 1.0–2.5)
ALBUMIN: 3.9 g/dL (ref 3.6–5.1)
ALKALINE PHOSPHATASE: 94 U/L (ref 37–153)
ALT (SGPT): 9 U/L (ref 6–29)
AST (SGOT): 15 U/L (ref 10–35)
BILIRUBIN, TOTAL: 0.4 mg/dL (ref 0.2–1.2)
BLOOD UREA NITROGEN: 9 mg/dL (ref 7–25)
CALCIUM: 9.4 mg/dL (ref 8.6–10.4)
CHLORIDE: 98 mmol/L (ref 98–110)
CO2: 31 mmol/L (ref 20–32)
CREATININE: 0.93 mg/dL (ref 0.50–1.05)
EGFR, CREATININE (CKD-EPI 2021) QUEST: 69 mL/min/{1.73_m2} (ref >=60)
GLOBULIN: 3.6 g/dL (ref 1.9–3.7)
GLUCOSE: 93 mg/dL (ref 65–99)
POTASSIUM: 3.8 mmol/L (ref 3.5–5.3)
PROTEIN, TOTAL, SPEP: 7.5 g/dL (ref 6.1–8.1)
SODIUM: 135 mmol/L (ref 135–146)

## 2023-01-22 LAB — SEDIMENTATION RATE (ESR): SED RATE BY MODIFIED WESTERGREN: 34 mm/h — ABNORMAL HIGH (ref <=30)

## 2023-01-22 LAB — MYOSITIS SPECIFIC 11 AB PANEL     (BH GH LMW Q YH)
ALPHA ABTIBODY: <11 SI (ref <11)
EJ ANTIBODY: <11 SI (ref <11)
JO1 ANTIBODY: <11 SI (ref <11)
MDA-5 ANTIBODY: <11 SI (ref <11)
MI-2 BETA ANTIBODY: <11 SI (ref <11)
NXP-2 ANTIBODY: <11 SI (ref <11)
OJ ANTIBODY: <11 SI (ref <11)
PL-12: <11 SI (ref <11)
PL-7: <11 SI (ref <11)
SRP ANTIBODY: <11 SI (ref <11)
TIF-1Y ANTIBODY: <11 SI (ref <11)

## 2023-01-22 LAB — ANA IFA SCREEN W/REFL TO TITER AND PATTERN, IFA
ANA SCREEN, IFA: POSITIVE — AB
ANA TITER 1: 1:320 {titer} — ABNORMAL HIGH

## 2023-01-22 LAB — DNA ANTIBODY, DOUBLE-STRANDED: DNA DS: 1 [IU]/mL

## 2023-01-22 LAB — CREATINE KINASE, TOTAL     (Q): CREATINE KINASE, TOTAL: 86 U/L (ref 29–143)

## 2023-01-22 LAB — C-REACTIVE PROTEIN     (CRP): C-REACTIVE PROTEIN: 11.8 mg/L — ABNORMAL HIGH (ref <8.0)

## 2023-01-22 LAB — SMITH/RNP ANTIBODY (GH L Q): SM/RNP ANTIBODY: <1 AI (ref <1.0)

## 2023-01-22 LAB — ALDOLASE: ALDOLASE: 3.6 U/L (ref <=8.1)

## 2023-02-06 ENCOUNTER — Encounter: Admit: 2023-02-06 | Payer: PRIVATE HEALTH INSURANCE | Primary: Adolescent Medicine

## 2023-02-15 ENCOUNTER — Encounter: Admit: 2023-02-15 | Payer: PRIVATE HEALTH INSURANCE | Attending: Rheumatology | Primary: Adolescent Medicine

## 2023-02-15 MED ORDER — HYDROXYZINE HCL 25 MG TABLET
25 | ORAL_TABLET | 1 refills | Status: AC
Start: 2023-02-15 — End: ?

## 2023-02-15 NOTE — Telephone Encounter
 Last visit: 7/23/24Next visit: 2/6/25Last labs:Lab Results Component Value Date  WBC 6.4 01/16/2023  HGB 14.5 01/16/2023  HCT 44.0 01/16/2023  MCV 87.6 01/16/2023  MCH 28.9 01/16/2023  MCHC 33.0 01/16/2023  PLT 311 01/16/2023  MPV 9.9 01/16/2023  NEUTROPHILS 54.2 01/16/2023  LYMPHOCYTES 37.3 01/16/2023  MONOCYTES 5.9 01/16/2023  EOSINOPHILS 1.7 01/16/2023  Lab Results Component Value Date  NA 135 01/16/2023  K 3.8 01/16/2023  CL 98 01/16/2023  CO2 31 01/16/2023  GLU 93 01/16/2023  BUN 9 01/16/2023  CREATININE 0.93 01/16/2023  EGFRAFRAMER 79 08/10/2016  CALCIUM 9.4 01/16/2023  ALBUMIN 3.9 01/16/2023  PROT 6.9 08/10/2016  BILITOT 0.4 01/16/2023  ALKPHOS 94 01/16/2023  ALT 9 01/16/2023  AST 15 01/16/2023  GLOB 3.6 01/16/2023  Lab Results Component Value Date  SEDRATE 34 (H) 01/16/2023

## 2023-02-22 ENCOUNTER — Encounter: Admit: 2023-02-22 | Payer: PRIVATE HEALTH INSURANCE | Primary: Adolescent Medicine

## 2023-03-06 ENCOUNTER — Encounter: Admit: 2023-03-06 | Payer: PRIVATE HEALTH INSURANCE | Primary: Adolescent Medicine

## 2023-05-15 ENCOUNTER — Encounter: Admit: 2023-05-15 | Payer: PRIVATE HEALTH INSURANCE | Attending: Rheumatology | Primary: Adolescent Medicine

## 2023-05-15 VITALS — BP 123/78 | HR 74 | Temp 97.60000°F | Wt 240.0 lb

## 2023-05-15 DIAGNOSIS — I10 Essential (primary) hypertension: Secondary | ICD-10-CM

## 2023-05-15 DIAGNOSIS — L282 Other prurigo: Secondary | ICD-10-CM

## 2023-05-15 DIAGNOSIS — L509 Urticaria, unspecified: Secondary | ICD-10-CM

## 2023-05-15 DIAGNOSIS — IMO0002 Lupus: Secondary | ICD-10-CM

## 2023-05-15 DIAGNOSIS — M329 Systemic lupus erythematosus, unspecified: Secondary | ICD-10-CM

## 2023-05-15 DIAGNOSIS — J849 Interstitial pulmonary disease, unspecified: Secondary | ICD-10-CM

## 2023-05-15 MED ORDER — IBUPROFEN 800 MG TABLET
800 | ORAL_TABLET | Freq: Three times a day (TID) | ORAL | 2 refills | 10.00000 days | Status: AC | PRN
Start: 2023-05-15 — End: ?

## 2023-05-15 MED ORDER — MYCOPHENOLATE MOFETIL 500 MG TABLET
500 | ORAL_TABLET | Freq: Two times a day (BID) | ORAL | 1 refills | 30.00000 days | Status: AC
Start: 2023-05-15 — End: ?

## 2023-05-15 MED ORDER — HYDROXYZINE HCL 25 MG TABLET
25 | ORAL_TABLET | INTRAMUSCULAR | 1 refills | 17.00000 days | Status: AC
Start: 2023-05-15 — End: ?

## 2023-05-15 NOTE — Progress Notes
 [name], [place], [address]UEAVW: [phone]  Fax: [phone] Turner Sheila Turner, MDSonia Laurette Turner, MDAmaad Cheree Turner, MDLais Sotero Turner, MDKristin Valeri Gate PA-CHPI DETAILS:Ms. Sheila Turner presents today for follow up of her SLE. She is not accompanied by her daughter.She states, I'm good.Her current medications include Cellcept  qd. This medication was prescribed bid, but she has been taking this qd. She would like ibuprofen 800 mg as it has been helpful for her in the past.She reports a pruritic rash on her right ankle. She found a cream prescribed by urgent care 3-4 years ago was helpful for this rash. She does not have any more cream and would like a referral to dermatology. She has a history of diffuse urticaria believed to be related to her SLE. She has been treated in the past with Xolair and Atarax . Currently, she has occasional episodes of urticaria.On 02/14/2021 she had a high resolution [place] that revealed interstitial lung disease that most likely represented lupus related interstitial lung disease although some changes could be smoking-related given the patient's history. She has reduced cigarette use and is now smoking 2-3 cigarettes per day.ROS:  She denies systemic symptoms specifically denying fever, fatigue, rash, adenopathy, dry eyes, dry mouth, oral ulcerations, alopecia, anorexia, weight loss, Raynaud?s phenomenon, dysphagia, photosensitivity, chest pain, SOB, or abdominal pain. She also denies eye inflammation, urinary tract symptoms, urethritis or diarrhea. She also denies scalp tenderness, jaw claudication, or visual disturbance.PMH: SLE, Hypertension, interstitial lung disease, COPD, urticaria, post cholecystectomy pulmonary embolism.PSH: Hysterectomy 31-May-1994), Cholecystectomy.FH: Her mother had arthritis. She has no family history of other Rheumatic Disease, Inflammatory Bowel Disease or Psoriasis. SH: She smokes 2-3 cigarettes a day having smoked 1 pack every 2-3 days in the past. She does not drink Alcohol. She drinks 2 cup of coffee a day and 2-3 cans of soda daily. She does not exercise. She is married x3 divorced x2. She has 4 children 71m, 60m, 1m, 43f. She has 15 grandchildren. 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 Her mother, Sheila Turner, also a former patient of mine, died in 2011/05/31.Current Outpatient Medications on File Prior to Visit Medication Sig Dispense Refill  albuterol sulfate (VENTOLIN HFA) 90 mcg/actuation HFA aerosol inhaler Inhale 2 puffs into the lungs every 6 (six) hours as needed for wheezing or shortness of breath. 18 g 11  benzonatate (TESSALON) 200 mg capsule Take 1 capsule (200 mg total) by mouth 3 (three) times daily as needed for cough. 30 capsule 2  cholecalciferol , vitamin D3, 1,250 mcg (50,000 unit) capsule Take 1 capsule (50,000 Units total) by mouth once a week. 12 capsule 0  EPINEPHrine (EPI-PEN) 0.3 mg/0.3 mL Inject 0.3 mg into the muscle once as needed. for up to 1 dose. As directed by MD. 2 Device 0  hydroCHLOROthiazide (HYDRODIURIL) 25 MG tablet Take 10 mg by mouth daily..    hydrOXYzine  (ATARAX ) 25 mg tablet TAKE 1 TO 2 TABLETS BY MOUTH THREE TIMES DAILY AS NEEDED FOR ITCHING 180 tablet 0  montelukast (SINGULAIR) 10 mg tablet Take 1 tablet (10 mg total) by mouth nightly. 30 tablet 6  mycophenolate  mofetil (CELLCEPT ) 500 mg tablet Take 1 tablet (500 mg total) by mouth 2 (two) times daily. 60 tablet 0  mycophenolate  mofetil (CELLCEPT ) 500 mg tablet Take 1 tablet by mouth twice daily 180 tablet 3  nystatin (MYCOSTATIN) 100,000 unit/mL suspension Take 5 mLs (500,000 Units total) by mouth 4 (four) times daily. 473 mL 2  verapamil (CALAN) 80 MG Immediate Release tablet Take 1 tablet (80 mg total) by mouth 2 (two) times daily (0800, 1800).    omeprazole (PRILOSEC) 40 MG capsule Take 1 capsule (40 mg total) by mouth daily. (Patient not taking: Reported on 05/15/2023)    predniSONE (DELTASONE) 5 mg tablet Take 1 tablet (5 mg total) by mouth daily. Take with food. (Patient not taking: Reported on 05/15/2023) 30 tablet 3 No current facility-administered medications on file prior to visit. Physical Exam EXAM DETAILS: BP 123/78  - Pulse 74  - Temp 97.6 ?F (36.4 ?C)  - Wt 108.9 kg  - LMP  (LMP Unknown)  - SpO2 97%  - BMI 37.59 kg/m? On exam today she was overweight but otherwise well appearing with a normal gait. She was oriented x 3. She had multiple moles on her face, back, and arms. She had no oral lesions. Conjunctivae were clear. She had no cervical or axillary adenopathy. Lungs were clear. Heart exam was WNL. She had no cyanosis or clubbing.  On examination of her joints she had normal DIPs, PIPs & MCPs.  She had full ROM of her wrists, elbows & shoulders.  She had full ROM of her neck & back.  She had full ROM of her hips.  Knees, ankles and feet were WNL. She had an erythematous, scaling patch on her right heel. She had no acute synovitis of any joints. She had no pedal edema.Right Ankle (05/15/2023)Laboratory test results from 01/16/2023 were reviewed.   No data to display       No data to display    ImagingHigh Contrast Chest [place] (02/14/2021) IMPRESSION: Interstitial lung disease as described above. Findings most likely represent lupus related interstitial lung disease although some of these changes could be smoking-related given the patient's history. ASSESSMENT/PLAN DETAILS: SLEMs. Sheila Turner comes in today for follow up of her SLE. She was noted on 02/14/2021 to have findings on high resolution [place] that most likely represent lupus related interstitial lung disease although some of these changes could be smoking-related given the patient's history. She was started on Cellcept  by Dr. Douglass Gelineau. Currently, she has no other symptoms suggestive of active SLE. She denies joint pain, joint swelling, fatigue, or other systemic symptoms. Her current medications include Cellcept  500 mg bid and Atarax  (for intermittent urticaria). She will continue her current regimen.ILDShe has changes consistent with ILD which may be related to her underlying SLE or to cigarette smoking. She was, once again, urged to discontinue smoking. She will follow up with Dr. Douglass Gelineau. UrticariaShe has a history of diffuse urticaria believed to be related to her SLE. She has been treated in the past with Xolair and Atarax . Currently, she has occasional episodes of urticaria. She is now treated with Atarax .Pruritic RashShe has a pruritic rash on her right ankle. She has run out of the cream prescribed to her 3-4 years ago by an urgent care. A referral to dermatology was placed. Cigarette useMs. Martucci was counseled on the dangers of tobacco use, and was, once again, advised to quit. She reports she has decreased smoking to 2-3 cigarettes per day.Scribed for Callista Hoh, Rhona Cerise, MD by Emory Harps, medical scribe May 15, 2023  The documentation recorded by the scribe accurately reflects the services I personally performed and the decisions made by me. I reviewed and confirmed all material entered and/or pre-charted by the scribe. Rhona Cerise Davide Risdon, MDElectronically Signed by Rhona Cerise Juline Sanderford, MD, May 15, 2023 I spent a total of 48 minutes on the  date of this encounter performing the following activities:   Preparing for the visit by reviewing history and previous tests, Performing an exam or evaluation, Counseling and educating the patient/family/caregiver, Ordering medications, tests, or procedures, Documenting clinical information in Epic and Interpreting and communicating results. This does not include any resident/fellow teaching time, or any time spent performing a procedural service.

## 2023-08-14 ENCOUNTER — Encounter: Admit: 2023-08-14 | Payer: PRIVATE HEALTH INSURANCE | Attending: Rheumatology | Primary: Adolescent Medicine

## 2023-08-14 DIAGNOSIS — L282 Other prurigo: Secondary | ICD-10-CM

## 2023-08-14 DIAGNOSIS — M329 Systemic lupus erythematosus, unspecified: Principal | ICD-10-CM

## 2023-08-14 DIAGNOSIS — J849 Interstitial pulmonary disease, unspecified: Secondary | ICD-10-CM

## 2023-08-14 DIAGNOSIS — J449 Chronic obstructive pulmonary disease, unspecified: Secondary | ICD-10-CM

## 2023-08-14 MED ORDER — MYCOPHENOLATE MOFETIL 500 MG TABLET
500 | ORAL_TABLET | Freq: Two times a day (BID) | ORAL | 1 refills | 30.00000 days | Status: AC
Start: 2023-08-14 — End: ?

## 2023-08-16 NOTE — Progress Notes
 [facility], [place], [address]Eynwz: [phone]  Fax: [name] Orris Turner, MDSonia Turner, MDAmaad Ulysess Turner, MDLais Turner, MDKristin Atilano PA-CHPI DETAILS:Last seen on 04/29/2025[name] presents today via video visit for follow up of her SLE.She states, I'm good.Her current medications include Cellcept  500 mg BID. She sometimes forgets to take the second dose. She also takes ibuprofen  800 mg as needed, usually one pill 1-2 times a week. Laboratory studies from 07/17/2023 were reviewed. Her kidney and liver functions were unremarkable.The pruritic rash on her ankle has resolved.She has a history of diffuse urticaria believed to be related to her SLE. She has been treated in the past with Xolair and Atarax . Currently, she has occasional episodes of urticaria.On 02/14/2021 she had a high resolution [place] that revealed interstitial lung disease that most likely represented lupus related interstitial lung disease although some changes could be smoking-related. She continues to smoke 2-3 cigarettes a day.ROS:  She denies systemic symptoms specifically denying fever, fatigue, rash, adenopathy, dry eyes, dry mouth, oral ulcerations, alopecia, anorexia, weight loss, Raynaud?s phenomenon, dysphagia, photosensitivity, chest pain, SOB, or abdominal pain. She also denies eye inflammation, urinary tract symptoms, urethritis or diarrhea. She also denies scalp tenderness, jaw claudication, or visual disturbance.PMH: SLE, Hypertension, interstitial lung disease, COPD, urticaria, post cholecystectomy pulmonary embolism.PSH: Hysterectomy Sep 18, 1994), Cholecystectomy.FH: Her mother had arthritis. She has no family history of other Rheumatic Disease, Inflammatory Bowel Disease or Psoriasis. SH: She smokes 2-3 cigarettes a day having smoked 1 pack every 2-3 days in the past. She does not drink Alcohol. She drinks 2 cup of coffee a day and 2-3 cans of soda daily. She does not exercise. She is married x3 divorced x2. She has 4 children 78m, 4m, 61m, 34f. She has 15 grandchildren. 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 Her mother, Sheila Turner, also a former patient of mine, died in Sep 18, 2011.Current Outpatient Medications Medication Sig  albuterol sulfate Inhale 2 puffs into the lungs every 6 (six) hours as needed for wheezing or shortness of breath.  benzonatate Take 1 capsule (200 mg total) by mouth 3 (three) times daily as needed for cough.  cholecalciferol  (vitamin D3) Take 1 capsule (50,000 Units total) by mouth once a week.  EPINEPHrine Inject 0.3 mg into the muscle once as needed. for up to 1 dose. As directed by MD.  hydroCHLOROthiazide Take 10 mg by mouth daily..  hydrOXYzine  TAKE 1 TO 2 TABLETS BY MOUTH THREE TIMES DAILY AS NEEDED FOR ITCHING  ibuprofen  Take 1 tablet (800 mg total) by mouth every 8 (eight) hours as needed.  montelukast Take 1 tablet (10 mg total) by mouth nightly.  mycophenolate  mofetil Take 1 tablet (500 mg total) by mouth 2 (two) times daily.  nystatin Take 5 mLs (500,000 Units total) by mouth 4 (four) times daily.  omeprazole Take 1 capsule (40 mg total) by mouth daily.  predniSONE Take 1 tablet (5 mg total) by mouth daily. Take with food. (Patient not taking: Reported on 05/15/2023)  verapamiL Take 1 tablet (80 mg total) by mouth 2 (two) times daily (0800, 1800). No current facility-administered medications for this visit. Physical Exam EXAM DETAILS: Because this visit was completed over video, a hands-on physical exam was not performed.  Patient/parent or guardian understands and knows to call back if condition changes.Right Ankle (05/15/2023)Laboratory test results from 07/17/2023 were reviewed.ImagingHigh Contrast Chest [place] (02/14/2021) IMPRESSION: Interstitial lung disease as described above. Findings most  likely represent lupus related interstitial lung disease although some of these changes could be smoking-related given the patient's history. ASSESSMENT/PLAN DETAILS: SLEMs. Turner presents today via video visit for follow up of her SLE and ILD . She was noted on 02/14/2021 to have findings on high resolution [place] that most likely represent lupus related interstitial lung disease. Currently, she has no other symptoms suggestive of active SLE. She denies joint pain, joint swelling, fatigue, or other systemic symptoms. She was started on Cellcept  500 mg bid by Dr. Julieann. She reports that she often forgets to take the second dose. Compliance with dosing was emphasized. She will continue her current regimen.ILDShe has changes consistent with ILD which may be related to her underlying SLE or to cigarette smoking. She was, once again, urged to discontinue smoking. She will follow up with Dr. Julieann. UrticariaShe has a history of diffuse urticaria believed to be related to her SLE. She has been treated in the past with Xolair and Atarax . Currently, she has occasional episodes of urticaria. She is now treated with Atarax .Pruritic RashThe pruritic rash on her right ankle has resolved.Cigarette useMs. Turner was counseled on the dangers of tobacco use, and was, once again, advised to quit. She reports she has decreased smoking to 2-3 cigarettes per day.Scribed for Sheila Turner, Sheila Dys, MD by Sheila Turner, medical scribe July 28, 2025The documentation recorded by the scribe accurately reflects the services I personally performed and the decisions made by me. I reviewed and confirmed all material entered and/or pre-charted by the scribe.I spent a total of 48 minutes on the date of this encounter performing the following activities:   Preparing for the visit by reviewing history and previous tests, Performing an exam or evaluation, Counseling and educating the patient/family/caregiver, Ordering medications, tests, or procedures, Documenting clinical information in Epic and Interpreting and communicating results. This does not include any resident/fellow teaching time, or any time spent performing a procedural service.VIDEO TELEHEALTH VISITS:  This clinician is part of the telehealth program and is conducting this visit in a currently approved location.  For this visit the clinician and patient were present via interactive audio & video telecommunications system that permits real-time communications. Consent was signed via the Patient Acknowledgement and Financial Authorization Form. The patient is admitted in a state where the clinician is appropriately licensed to provide care for this visit.The clinician is in the same building as the patient: No Other individuals present during the telehealth encounter and their role/relation: noneBecause this visit was completed over video, a hands-on physical exam was not performed.  Patient/parent or guardian understands and knows to call back if condition changes.

## 2023-09-19 ENCOUNTER — Encounter: Admit: 2023-09-19 | Payer: PRIVATE HEALTH INSURANCE | Primary: Adolescent Medicine

## 2023-09-19 DIAGNOSIS — L509 Urticaria, unspecified: Principal | ICD-10-CM

## 2023-09-19 DIAGNOSIS — IMO0002 Lupus: Secondary | ICD-10-CM

## 2023-09-19 DIAGNOSIS — I10 Essential (primary) hypertension: Secondary | ICD-10-CM

## 2023-11-02 ENCOUNTER — Encounter: Admit: 2023-11-02 | Payer: PRIVATE HEALTH INSURANCE | Attending: Rheumatology | Primary: Adolescent Medicine

## 2023-11-02 DIAGNOSIS — J849 Interstitial pulmonary disease, unspecified: Secondary | ICD-10-CM

## 2023-11-02 DIAGNOSIS — M329 Systemic lupus erythematosus, unspecified: Principal | ICD-10-CM

## 2023-12-28 ENCOUNTER — Encounter: Admit: 2023-12-28 | Payer: PRIVATE HEALTH INSURANCE | Attending: Rheumatology | Primary: Adolescent Medicine

## 2023-12-28 DIAGNOSIS — J849 Interstitial pulmonary disease, unspecified: Principal | ICD-10-CM

## 2023-12-31 NOTE — Telephone Encounter [36]
 RN spoke with pt to get updated labs.Last visit: 7/29/2025Next visit: 04/15/2024 & 5/19/2026Last labs:Lab Results Component Value Date  WBC 6.4 01/16/2023  HGB 14.5 01/16/2023  HCT 44.0 01/16/2023  MCV 87.6 01/16/2023  MCH 28.9 01/16/2023  MCHC 33.0 01/16/2023  PLT 311 01/16/2023  MPV 9.9 01/16/2023  NEUTROPHILS 54.2 01/16/2023  LYMPHOCYTES 37.3 01/16/2023  MONOCYTES 5.9 01/16/2023  EOSINOPHILS 1.7 01/16/2023  Lab Results Component Value Date  NA 135 01/16/2023  K 3.8 01/16/2023  CL 98 01/16/2023  CO2 31 01/16/2023  GLU 93 01/16/2023  BUN 9 01/16/2023  CREATININE 0.93 01/16/2023  EGFRAFRAMER 79 08/10/2016  CALCIUM 9.4 01/16/2023  ALBUMIN 3.9 01/16/2023  PROT 6.9 08/10/2016  BILITOT 0.4 01/16/2023  ALKPHOS 94 01/16/2023  ALT 9 01/16/2023  AST 15 01/16/2023  GLOB 3.6 01/16/2023  Lab Results Component Value Date  CRPQ 11.8 (H) 01/16/2023  SEDRATE 34 (H) 01/16/2023

## 2024-01-01 ENCOUNTER — Inpatient Hospital Stay: Admit: 2024-01-01 | Discharge: 2024-01-01 | Payer: Managed Care (Private) | Primary: Adolescent Medicine

## 2024-01-01 ENCOUNTER — Encounter: Admit: 2024-01-01 | Payer: PRIVATE HEALTH INSURANCE | Attending: Rheumatology | Primary: Adolescent Medicine

## 2024-01-01 ENCOUNTER — Ambulatory Visit: Admit: 2024-01-01 | Payer: Managed Care (Private) | Attending: Rheumatology | Primary: Adolescent Medicine

## 2024-01-01 DIAGNOSIS — J849 Interstitial pulmonary disease, unspecified: Secondary | ICD-10-CM

## 2024-01-01 DIAGNOSIS — M329 Systemic lupus erythematosus, unspecified: Secondary | ICD-10-CM

## 2024-01-01 LAB — COMPREHENSIVE METABOLIC PANEL
BKR A/G RATIO: 1 (ref 1.0–2.2)
BKR ALANINE AMINOTRANSFERASE (ALT): 8 U/L — ABNORMAL LOW (ref 10–35)
BKR ALBUMIN: 3.8 g/dL (ref 3.6–5.1)
BKR ALKALINE PHOSPHATASE: 113 U/L (ref 9–122)
BKR ANION GAP: 12 (ref 7–17)
BKR ASPARTATE AMINOTRANSFERASE (AST): 20 U/L (ref 10–35)
BKR AST/ALT RATIO: 2.5
BKR BILIRUBIN TOTAL: 0.5 mg/dL (ref <=1.2)
BKR BLOOD UREA NITROGEN: 10 mg/dL (ref 8–23)
BKR BUN / CREAT RATIO: 10 (ref 8.0–23.0)
BKR CALCIUM: 9.4 mg/dL (ref 8.8–10.2)
BKR CHLORIDE: 98 mmol/L (ref 98–107)
BKR CO2: 29 mmol/L (ref 20–30)
BKR CREATININE DELTA: 0.07
BKR CREATININE: 1 mg/dL (ref 0.40–1.30)
BKR EGFR, CREATININE (CKD-EPI 2021): >60 mL/min/1.73m2 (ref >=60)
BKR GLOBULIN: 4 g/dL — ABNORMAL HIGH (ref 2.0–3.9)
BKR GLUCOSE: 108 mg/dL — ABNORMAL HIGH (ref 70–100)
BKR POTASSIUM: 3.6 mmol/L (ref 3.3–5.3)
BKR PROTEIN TOTAL: 7.8 g/dL (ref 5.9–8.3)
BKR SODIUM: 139 mmol/L (ref 136–144)

## 2024-01-01 LAB — CBC WITH AUTO DIFFERENTIAL
BKR WAM ABSOLUTE IMMATURE GRANULOCYTES.: 0.01 x 1000/ÂµL (ref 0.00–0.30)
BKR WAM ABSOLUTE LYMPHOCYTE COUNT.: 2.42 x 1000/ÂµL (ref 0.60–3.70)
BKR WAM ABSOLUTE NRBC: 0 x 1000/ÂµL (ref 0.00–1.00)
BKR WAM ANC (ABSOLUTE NEUTROPHIL COUNT): 4.38 x 1000/ÂµL (ref 2.00–7.60)
BKR WAM BASOPHIL ABSOLUTE COUNT.: 0.05 x 1000/ÂµL (ref 0.00–1.00)
BKR WAM BASOPHILS: 0.7 % (ref 0.0–1.4)
BKR WAM EOSINOPHIL ABSOLUTE COUNT.: 0.1 x 1000/ÂµL (ref 0.00–1.00)
BKR WAM EOSINOPHILS: 1.4 % (ref 0.0–5.0)
BKR WAM HEMATOCRIT: 46.6 % — ABNORMAL HIGH (ref 35.00–45.00)
BKR WAM HEMOGLOBIN: 14.6 g/dL (ref 11.7–15.5)
BKR WAM IMMATURE GRANULOCYTES: 0.1 % (ref 0.0–1.0)
BKR WAM LYMPHOCYTES: 32.9 % (ref 17.0–50.0)
BKR WAM MCH: 28.5 pg (ref 27.0–33.0)
BKR WAM MCHC: 31.3 g/dL (ref 31.0–36.0)
BKR WAM MCV: 90.8 fL (ref 80.0–100.0)
BKR WAM MONOCYTE ABSOLUTE COUNT.: 0.39 x 1000/ÂµL (ref 0.00–1.00)
BKR WAM MONOCYTES: 5.3 % (ref 4.0–12.0)
BKR WAM MPV: 10.6 fL (ref 8.0–12.0)
BKR WAM NEUTROPHILS: 59.6 % (ref 39.0–72.0)
BKR WAM NUCLEATED RED BLOOD CELLS: 0 % (ref 0.0–1.0)
BKR WAM PLATELETS: 287 x1000/ÂµL (ref 150–420)
BKR WAM RDW-CV: 14.9 % (ref 11.0–15.0)
BKR WAM RED BLOOD CELL COUNT.: 5.13 M/ÂµL (ref 4.00–6.00)
BKR WAM WHITE BLOOD CELL COUNT: 7.4 x1000/ÂµL (ref 4.0–11.0)

## 2024-01-01 LAB — SEDIMENTATION RATE (ESR): BKR SEDIMENTATION RATE, ERYTHROCYTE: 31 mm/h — ABNORMAL HIGH (ref 0–20)

## 2024-01-01 LAB — PROTEIN, TOTAL W/CREATININE, URINE, RANDOM     (BH GH LMW YH)
BKR CREATININE, URINE, RANDOM: 284 mg/dL
BKR PROTEIN URINE RANDOM: 0.27 g/L
BKR PROTEIN/CREATININE RATIO, URINE, RANDOM: 0.1 mg/mg{creat} — ABNORMAL HIGH (ref <0.10)

## 2024-01-01 LAB — C3 + C4 COMPLEMENT COMPONENT     (BH GH Q)
BKR C3 COMPLEMENT: 174 mg/dL (ref 90–180)
BKR C4 COMPLEMENT: 40 mg/dL (ref 10–40)

## 2024-01-01 LAB — C-REACTIVE PROTEIN     (CRP): BKR C-REACTIVE PROTEIN, HIGH SENSITIVITY: 12 mg/L — ABNORMAL HIGH

## 2024-01-01 MED ORDER — MYCOPHENOLATE MOFETIL 500 MG TABLET
500 | ORAL_TABLET | Freq: Two times a day (BID) | ORAL | 2 refills | 30.00000 days | Status: AC
Start: 2024-01-01 — End: ?

## 2024-01-02 LAB — DNA ANTIBODY, DOUBLE-STRANDED: BKR DSDNA AB: 4.1 [IU]/mL (ref <10.0)

## 2024-01-18 ENCOUNTER — Encounter: Admit: 2024-01-18 | Payer: PRIVATE HEALTH INSURANCE | Attending: Rheumatology | Primary: Adolescent Medicine

## 2024-01-18 NOTE — Telephone Encounter [36]
 Date of last visit: 7/29/25Date of next visit:3/31/26Last labs:Lab Results Component Value Date  WBC 7.4 01/01/2024  HGB 14.6 01/01/2024  HCT 46.60 (H) 01/01/2024  MCV 90.8 01/01/2024  MCH 28.5 01/01/2024  MCHC 31.3 01/01/2024  PLT 287 01/01/2024  MPV 10.6 01/01/2024  NEUTROPHILS 59.6 01/01/2024  LYMPHOCYTES 32.9 01/01/2024  MONOCYTES 5.3 01/01/2024  EOSINOPHILS 1.4 01/01/2024  ANCANC 4.38 01/01/2024  LYMPHABS 2.42 01/01/2024  Lab Results Component Value Date  NA 139 01/01/2024  K 3.6 01/01/2024  CL 98 01/01/2024  CO2 29 01/01/2024  GLU 108 (H) 01/01/2024  BUN 10 01/01/2024  CREATININE 1.00 01/01/2024  EGFRAFRAMER 79 08/10/2016  CALCIUM 9.4 01/01/2024  ALBUMIN 3.8 01/01/2024  PROT 7.8 01/01/2024  BILITOT 0.5 01/01/2024  ALKPHOS 113 01/01/2024  ALT 8 (L) 01/01/2024  AST 20 01/01/2024  GLOB 4.0 (H) 01/01/2024  Lab Results Component Value Date  HSCRP 12.0 (H) 01/01/2024  SEDRATE 31 (H) 01/01/2024

## 2024-01-21 MED ORDER — VERAPAMIL IMMEDIATE RELEASE 80 MG TABLET
80 | ORAL_TABLET | Freq: Two times a day (BID) | ORAL | 2 refills | 90.00000 days | Status: AC
Start: 2024-01-21 — End: ?

## 2024-01-25 ENCOUNTER — Encounter: Admit: 2024-01-25 | Payer: PRIVATE HEALTH INSURANCE | Attending: Rheumatology | Primary: Adolescent Medicine

## 2024-01-25 DIAGNOSIS — J849 Interstitial pulmonary disease, unspecified: Secondary | ICD-10-CM

## 2024-01-25 DIAGNOSIS — M329 Systemic lupus erythematosus, unspecified: Principal | ICD-10-CM

## 2024-04-15 ENCOUNTER — Encounter: Admit: 2024-04-15 | Payer: PRIVATE HEALTH INSURANCE | Attending: Rheumatology | Primary: Adolescent Medicine

## 2024-06-03 ENCOUNTER — Encounter: Admit: 2024-06-03 | Payer: PRIVATE HEALTH INSURANCE | Attending: Rheumatology | Primary: Adolescent Medicine

## 2024-06-17 ENCOUNTER — Encounter: Admit: 2024-06-17 | Payer: PRIVATE HEALTH INSURANCE | Attending: Rheumatology | Primary: Adolescent Medicine
